# Patient Record
Sex: Male | Born: 1935 | Race: White | Hispanic: No | Smoking: Former smoker
Health system: Southern US, Community
[De-identification: ages and names within clinical notes are randomized; demographics above are authoritative.]

## PROBLEM LIST (undated history)

## (undated) DIAGNOSIS — J984 Other disorders of lung: Secondary | ICD-10-CM

## (undated) DIAGNOSIS — R6889 Other general symptoms and signs: Secondary | ICD-10-CM

## (undated) DIAGNOSIS — J1189 Influenza due to unidentified influenza virus with other manifestations: Secondary | ICD-10-CM

## (undated) DIAGNOSIS — C3490 Malignant neoplasm of unspecified part of unspecified bronchus or lung: Secondary | ICD-10-CM

## (undated) DIAGNOSIS — R0602 Shortness of breath: Secondary | ICD-10-CM

## (undated) DIAGNOSIS — R6883 Chills (without fever): Secondary | ICD-10-CM

## (undated) DIAGNOSIS — N390 Urinary tract infection, site not specified: Secondary | ICD-10-CM

## (undated) DIAGNOSIS — I10 Essential (primary) hypertension: Secondary | ICD-10-CM

## (undated) DIAGNOSIS — E7800 Pure hypercholesterolemia, unspecified: Secondary | ICD-10-CM

## (undated) HISTORY — DX: Pure hypercholesterolemia, unspecified: E78.00

## (undated) HISTORY — DX: Malignant neoplasm of unspecified part of unspecified bronchus or lung: C34.90

## (undated) HISTORY — DX: Other general symptoms and signs: R68.89

## (undated) HISTORY — DX: Other disorders of lung: J98.4

## (undated) HISTORY — DX: Essential (primary) hypertension: I10

## (undated) HISTORY — DX: Influenza due to unidentified influenza virus with other manifestations: J11.89

## (undated) HISTORY — DX: Urinary tract infection, site not specified: N39.0

## (undated) HISTORY — DX: Shortness of breath: R06.02

## (undated) HISTORY — DX: Chills (without fever): R68.83

---
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
## 1973-07-08 HISTORY — PX: HEMORRHOID SURGERY: SHX153

## 1973-07-08 HISTORY — PX: CHOLECYSTECTOMY: SHX55

## 2004-07-08 HISTORY — PX: THORACOTOMY: SUR1349

## 2005-07-08 HISTORY — PX: CORONARY ARTERY BYPASS GRAFT: SHX141

## 2006-08-01 ENCOUNTER — Ambulatory Visit: Payer: Self-pay | Admitting: Cardiology

## 2006-08-05 ENCOUNTER — Ambulatory Visit: Payer: Self-pay | Admitting: Cardiology

## 2006-08-05 LAB — CONVERTED CEMR LAB
Albumin: 3.2 g/dL — ABNORMAL LOW (ref 3.5–5.2)
Alkaline Phosphatase: 74 units/L (ref 39–117)
BUN: 21 mg/dL (ref 6–23)
Basophils Absolute: 0 10*3/uL (ref 0.0–0.1)
Calcium: 9.1 mg/dL (ref 8.4–10.5)
Cholesterol: 117 mg/dL (ref 0–200)
GFR calc Af Amer: 84 mL/min
HDL: 26.5 mg/dL — ABNORMAL LOW (ref >39.0)
Hemoglobin: 12 g/dL — ABNORMAL LOW (ref 13.0–17.0)
LDL Cholesterol: 72 mg/dL (ref 0–99)
Lymphocytes Relative: 31.9 % (ref 12.0–46.0)
MCHC: 35.4 g/dL (ref 30.0–36.0)
Monocytes Absolute: 0.4 10*3/uL (ref 0.2–0.7)
Monocytes Relative: 9.7 % (ref 3.0–11.0)
Neutro Abs: 2.3 10*3/uL (ref 1.4–7.7)
Platelets: 107 10*3/uL — ABNORMAL LOW (ref 150–400)
Potassium: 3.9 meq/L (ref 3.5–5.1)
RDW: 20.6 % — ABNORMAL HIGH (ref 11.5–14.6)
Triglycerides: 92 mg/dL (ref 0–149)

## 2006-08-07 ENCOUNTER — Encounter (HOSPITAL_COMMUNITY): Admission: RE | Admit: 2006-08-07 | Discharge: 2006-11-05 | Payer: Self-pay | Admitting: Cardiology

## 2006-09-02 ENCOUNTER — Encounter: Payer: Self-pay | Admitting: Physician Assistant

## 2006-09-02 ENCOUNTER — Ambulatory Visit: Payer: Self-pay | Admitting: Cardiology

## 2006-09-02 LAB — CONVERTED CEMR LAB
CO2: 24 meq/L (ref 19–32)
Calcium: 9.3 mg/dL (ref 8.4–10.5)
Chloride: 110 meq/L (ref 96–112)
Creatinine, Ser: 1 mg/dL (ref 0.4–1.5)
Glucose, Bld: 91 mg/dL (ref 70–99)

## 2006-09-15 ENCOUNTER — Ambulatory Visit: Payer: Self-pay

## 2006-09-15 ENCOUNTER — Encounter: Payer: Self-pay | Admitting: Internal Medicine

## 2006-09-22 ENCOUNTER — Ambulatory Visit: Payer: Self-pay | Admitting: Cardiology

## 2006-09-25 ENCOUNTER — Ambulatory Visit: Payer: Self-pay | Admitting: Cardiology

## 2006-09-29 ENCOUNTER — Ambulatory Visit: Payer: Self-pay | Admitting: Cardiology

## 2006-09-29 LAB — CONVERTED CEMR LAB
Basophils Relative: 1 % (ref 0.0–1.0)
CO2: 26 meq/L (ref 19–32)
Calcium: 9.1 mg/dL (ref 8.4–10.5)
Eosinophils Relative: 5.3 % — ABNORMAL HIGH (ref 0.0–5.0)
GFR calc Af Amer: 106 mL/min
Glucose, Bld: 93 mg/dL (ref 70–99)
HCT: 34.1 % — ABNORMAL LOW (ref 39.0–52.0)
Hemoglobin: 12.2 g/dL — ABNORMAL LOW (ref 13.0–17.0)
Lymphocytes Relative: 37 % (ref 12.0–46.0)
Monocytes Absolute: 0.4 10*3/uL (ref 0.2–0.7)
Neutro Abs: 1.9 10*3/uL (ref 1.4–7.7)
Platelets: 113 10*3/uL — ABNORMAL LOW (ref 150–400)
Prothrombin Time: 14.1 s — ABNORMAL HIGH (ref 10.0–14.0)
WBC: 3.9 10*3/uL — ABNORMAL LOW (ref 4.5–10.5)

## 2006-09-30 ENCOUNTER — Inpatient Hospital Stay (HOSPITAL_BASED_OUTPATIENT_CLINIC_OR_DEPARTMENT_OTHER): Admission: RE | Admit: 2006-09-30 | Discharge: 2006-10-01 | Payer: Self-pay | Admitting: Internal Medicine

## 2006-10-01 ENCOUNTER — Inpatient Hospital Stay (HOSPITAL_COMMUNITY): Admission: AD | Admit: 2006-10-01 | Discharge: 2006-10-02 | Payer: Self-pay | Admitting: Internal Medicine

## 2006-10-01 ENCOUNTER — Ambulatory Visit: Payer: Self-pay | Admitting: Internal Medicine

## 2006-10-01 ENCOUNTER — Ambulatory Visit: Payer: Self-pay | Admitting: Cardiothoracic Surgery

## 2006-10-06 ENCOUNTER — Ambulatory Visit (HOSPITAL_COMMUNITY): Admission: RE | Admit: 2006-10-06 | Discharge: 2006-10-06 | Payer: Self-pay | Admitting: Internal Medicine

## 2006-10-06 ENCOUNTER — Encounter (INDEPENDENT_AMBULATORY_CARE_PROVIDER_SITE_OTHER): Payer: Self-pay | Admitting: Specialist

## 2006-10-13 ENCOUNTER — Ambulatory Visit: Payer: Self-pay | Admitting: Internal Medicine

## 2006-10-15 ENCOUNTER — Encounter: Admission: RE | Admit: 2006-10-15 | Discharge: 2006-10-15 | Payer: Self-pay | Admitting: Internal Medicine

## 2006-10-16 ENCOUNTER — Ambulatory Visit: Payer: Self-pay | Admitting: Emergency Medicine

## 2006-10-24 ENCOUNTER — Ambulatory Visit: Payer: Self-pay | Admitting: Cardiothoracic Surgery

## 2006-11-03 ENCOUNTER — Ambulatory Visit: Payer: Self-pay | Admitting: Cardiology

## 2006-11-04 ENCOUNTER — Ambulatory Visit: Payer: Self-pay | Admitting: Internal Medicine

## 2006-11-06 ENCOUNTER — Encounter (HOSPITAL_COMMUNITY): Admission: RE | Admit: 2006-11-06 | Discharge: 2007-02-04 | Payer: Self-pay | Admitting: Cardiology

## 2006-11-10 ENCOUNTER — Encounter: Admission: RE | Admit: 2006-11-10 | Discharge: 2006-11-10 | Payer: Self-pay | Admitting: Cardiothoracic Surgery

## 2006-12-05 ENCOUNTER — Ambulatory Visit: Payer: Self-pay | Admitting: Cardiothoracic Surgery

## 2007-01-01 ENCOUNTER — Ambulatory Visit: Payer: Self-pay | Admitting: Internal Medicine

## 2007-01-29 ENCOUNTER — Ambulatory Visit: Payer: Self-pay | Admitting: Internal Medicine

## 2007-02-11 ENCOUNTER — Ambulatory Visit: Payer: Self-pay | Admitting: Emergency Medicine

## 2007-02-12 ENCOUNTER — Ambulatory Visit: Payer: Self-pay | Admitting: Emergency Medicine

## 2007-02-12 LAB — CONVERTED CEMR LAB
BUN: 19 mg/dL (ref 6–23)
CO2: 23 meq/L (ref 19–32)
Calcium: 9.1 mg/dL (ref 8.4–10.5)
Creatinine, Ser: 1.1 mg/dL (ref 0.4–1.5)
Potassium: 3.8 meq/L (ref 3.5–5.1)

## 2007-02-13 ENCOUNTER — Ambulatory Visit: Payer: Self-pay | Admitting: Cardiology

## 2007-02-25 ENCOUNTER — Ambulatory Visit: Payer: Self-pay | Admitting: Emergency Medicine

## 2007-02-27 ENCOUNTER — Ambulatory Visit (HOSPITAL_COMMUNITY): Admission: RE | Admit: 2007-02-27 | Discharge: 2007-02-27 | Payer: Self-pay | Admitting: Emergency Medicine

## 2007-03-13 DIAGNOSIS — I10 Essential (primary) hypertension: Secondary | ICD-10-CM | POA: Insufficient documentation

## 2007-03-13 DIAGNOSIS — E7800 Pure hypercholesterolemia, unspecified: Secondary | ICD-10-CM

## 2007-03-13 DIAGNOSIS — I2510 Atherosclerotic heart disease of native coronary artery without angina pectoris: Secondary | ICD-10-CM | POA: Insufficient documentation

## 2007-03-13 DIAGNOSIS — J984 Other disorders of lung: Secondary | ICD-10-CM | POA: Insufficient documentation

## 2007-03-16 ENCOUNTER — Ambulatory Visit: Payer: Self-pay | Admitting: Emergency Medicine

## 2007-04-15 ENCOUNTER — Ambulatory Visit: Payer: Self-pay | Admitting: Internal Medicine

## 2007-05-19 ENCOUNTER — Ambulatory Visit: Payer: Self-pay | Admitting: Internal Medicine

## 2007-05-20 ENCOUNTER — Ambulatory Visit: Payer: Self-pay | Admitting: Internal Medicine

## 2007-05-20 ENCOUNTER — Inpatient Hospital Stay (HOSPITAL_COMMUNITY): Admission: EM | Admit: 2007-05-20 | Discharge: 2007-05-20 | Payer: Self-pay | Admitting: Emergency Medicine

## 2007-05-22 ENCOUNTER — Telehealth: Payer: Self-pay | Admitting: Internal Medicine

## 2007-06-22 ENCOUNTER — Ambulatory Visit: Payer: Self-pay | Admitting: Internal Medicine

## 2007-06-22 ENCOUNTER — Telehealth: Payer: Self-pay | Admitting: Internal Medicine

## 2007-06-24 ENCOUNTER — Ambulatory Visit: Payer: Self-pay | Admitting: Internal Medicine

## 2007-06-24 ENCOUNTER — Telehealth: Payer: Self-pay | Admitting: Internal Medicine

## 2007-06-24 LAB — CONVERTED CEMR LAB
Basophils Absolute: 0 10*3/uL (ref 0.0–0.1)
Eosinophils Absolute: 0 10*3/uL (ref 0.0–0.6)
HCT: 32.5 % — ABNORMAL LOW (ref 39.0–52.0)
Hemoglobin: 11.5 g/dL — ABNORMAL LOW (ref 13.0–17.0)
Lymphocytes Relative: 5.2 % — ABNORMAL LOW (ref 12.0–46.0)
MCHC: 35.3 g/dL (ref 30.0–36.0)
MCV: 99.9 fL (ref 78.0–100.0)
Monocytes Absolute: 0.1 10*3/uL — ABNORMAL LOW (ref 0.2–0.7)
Neutro Abs: 4.2 10*3/uL (ref 1.4–7.7)
Neutrophils Relative %: 90.3 % — ABNORMAL HIGH (ref 43.0–77.0)

## 2007-08-11 ENCOUNTER — Ambulatory Visit: Payer: Self-pay | Admitting: Endocrinology

## 2007-08-14 ENCOUNTER — Ambulatory Visit: Payer: Self-pay | Admitting: Cardiovascular Disease

## 2007-08-14 ENCOUNTER — Inpatient Hospital Stay (HOSPITAL_COMMUNITY): Admission: AD | Admit: 2007-08-14 | Discharge: 2007-08-15 | Payer: Self-pay | Admitting: Internal Medicine

## 2007-08-14 ENCOUNTER — Encounter: Payer: Self-pay | Admitting: Internal Medicine

## 2007-08-14 ENCOUNTER — Ambulatory Visit: Payer: Self-pay | Admitting: Internal Medicine

## 2007-08-17 ENCOUNTER — Telehealth (INDEPENDENT_AMBULATORY_CARE_PROVIDER_SITE_OTHER): Payer: Self-pay | Admitting: *Deleted

## 2007-08-18 ENCOUNTER — Telehealth: Payer: Self-pay | Admitting: Internal Medicine

## 2007-08-19 ENCOUNTER — Encounter: Payer: Self-pay | Admitting: Internal Medicine

## 2007-08-25 ENCOUNTER — Telehealth: Payer: Self-pay | Admitting: Internal Medicine

## 2007-09-01 ENCOUNTER — Encounter: Payer: Self-pay | Admitting: Emergency Medicine

## 2007-09-04 ENCOUNTER — Ambulatory Visit: Payer: Self-pay | Admitting: Emergency Medicine

## 2007-09-04 LAB — CONVERTED CEMR LAB
GFR calc non Af Amer: 78 mL/min
Potassium: 4.1 meq/L (ref 3.5–5.1)
Sodium: 139 meq/L (ref 135–145)

## 2007-09-09 ENCOUNTER — Ambulatory Visit: Payer: Self-pay | Admitting: Cardiology

## 2007-12-05 ENCOUNTER — Encounter: Payer: Self-pay | Admitting: Internal Medicine

## 2007-12-05 ENCOUNTER — Ambulatory Visit: Payer: Self-pay | Admitting: Family Medicine

## 2007-12-05 LAB — CONVERTED CEMR LAB
Bilirubin Urine: NEGATIVE
Specific Gravity, Urine: 1.02
Urobilinogen, UA: 0.2

## 2007-12-08 ENCOUNTER — Encounter: Payer: Self-pay | Admitting: Internal Medicine

## 2008-01-02 ENCOUNTER — Encounter: Payer: Self-pay | Admitting: Internal Medicine

## 2008-01-02 ENCOUNTER — Ambulatory Visit: Payer: Self-pay | Admitting: Family Medicine

## 2008-01-02 LAB — CONVERTED CEMR LAB
Bilirubin Urine: NEGATIVE
Nitrite: NEGATIVE
Urobilinogen, UA: 0.2
pH: 8

## 2008-01-14 ENCOUNTER — Ambulatory Visit: Payer: Self-pay | Admitting: Cardiology

## 2008-01-14 LAB — CONVERTED CEMR LAB
ALT: 30 units/L (ref 0–53)
Bilirubin, Direct: 0.1 mg/dL (ref 0.0–0.3)
CO2: 25 meq/L (ref 19–32)
Calcium: 8.8 mg/dL (ref 8.4–10.5)
Creatinine, Ser: 1.3 mg/dL (ref 0.4–1.5)
GFR calc Af Amer: 69 mL/min
GFR calc non Af Amer: 57 mL/min
HDL: 36.1 mg/dL — ABNORMAL LOW (ref >39.0)
LDL Cholesterol: 56 mg/dL (ref 0–99)
Sodium: 135 meq/L (ref 135–145)
Total Bilirubin: 0.8 mg/dL (ref 0.3–1.2)
Total CHOL/HDL Ratio: 3.2

## 2008-04-29 ENCOUNTER — Telehealth: Payer: Self-pay | Admitting: Internal Medicine

## 2008-07-25 ENCOUNTER — Telehealth: Payer: Self-pay | Admitting: Internal Medicine

## 2008-07-25 ENCOUNTER — Observation Stay (HOSPITAL_COMMUNITY): Admission: AD | Admit: 2008-07-25 | Discharge: 2008-07-26 | Payer: Self-pay | Admitting: Internal Medicine

## 2008-07-25 ENCOUNTER — Ambulatory Visit: Payer: Self-pay | Admitting: Internal Medicine

## 2008-07-25 DIAGNOSIS — R0602 Shortness of breath: Secondary | ICD-10-CM | POA: Insufficient documentation

## 2008-07-26 ENCOUNTER — Encounter: Payer: Self-pay | Admitting: Internal Medicine

## 2008-08-01 ENCOUNTER — Ambulatory Visit: Payer: Self-pay

## 2008-08-01 ENCOUNTER — Encounter: Payer: Self-pay | Admitting: Internal Medicine

## 2008-11-01 ENCOUNTER — Telehealth: Payer: Self-pay | Admitting: Internal Medicine

## 2008-11-07 ENCOUNTER — Ambulatory Visit: Payer: Self-pay | Admitting: Cardiology

## 2008-11-08 ENCOUNTER — Ambulatory Visit: Payer: Self-pay | Admitting: Cardiology

## 2008-11-08 LAB — CONVERTED CEMR LAB
Albumin: 4 g/dL (ref 3.5–5.2)
CO2: 23 meq/L (ref 19–32)
Chloride: 107 meq/L (ref 96–112)
Cholesterol: 147 mg/dL (ref 0–200)
Glucose, Bld: 101 mg/dL — ABNORMAL HIGH (ref 70–99)
HDL: 29.9 mg/dL — ABNORMAL LOW (ref >39.00)
Potassium: 4.2 meq/L (ref 3.5–5.1)
Sodium: 139 meq/L (ref 135–145)
Total Bilirubin: 1 mg/dL (ref 0.3–1.2)
Total CHOL/HDL Ratio: 5
Triglycerides: 349 mg/dL — ABNORMAL HIGH (ref 0.0–149.0)
VLDL: 69.8 mg/dL — ABNORMAL HIGH (ref 0.0–40.0)

## 2008-12-09 IMAGING — CT CT BIOPSY
1 series · 15 of 33 positions shown, 19 images · non-contrast
Comparison: none

CLINICAL DATA: There is a pericardial loculated cystic lesion anterior to the right ventricle.
 CT GUIDED CORE BIOPSY AND ASPIRATION OF A PERICARDIAL CYSTIC LESION ? 10/06/06 AT 4044 HOURS:
 Procedure:  In the supine position, the subxiphoid area was prepped and draped in a sterile fashion.  Lidocaine was utilized for local anesthesia.  Under CT guidance, a 17 gauge guide-needle was inserted into the subcutaneous fat just to the left of the xiphoid process. The tip of the guide-needle was positioned just superficial to the wall of the cystic lesion.  A single 18 gauge core biopsy was obtained.
 The needle was then advanced into the cystic portion and 70 cc of clear yellow fluid was aspirated.  No complications.

[Series 2: routine chest · axial · 0.72mm/px · z∈[-240,-125]mm · 15 of 63 slices shown, 19 images]
[im 5/63  mediastinal]
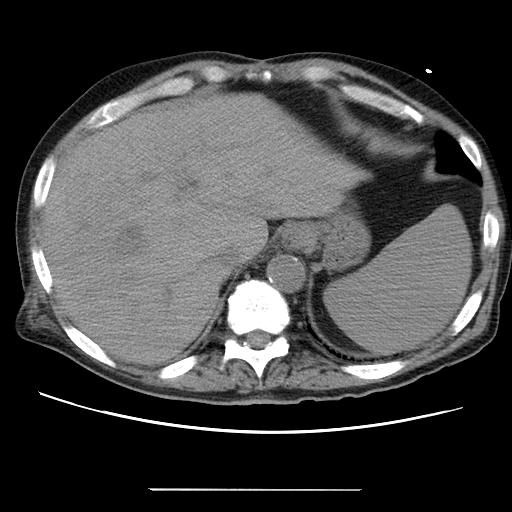
[im 5/63  lung]
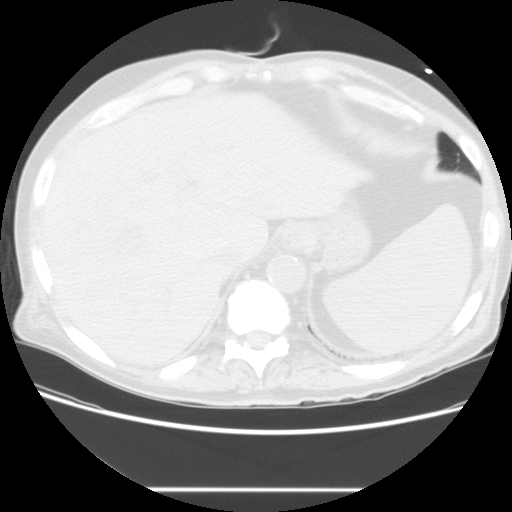
[im 10/63  lung]
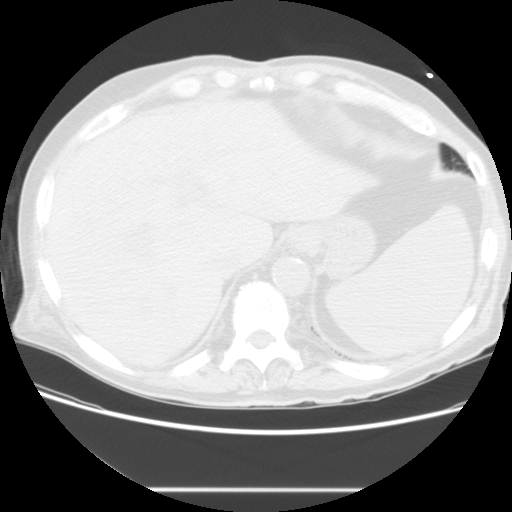
[im 13/63  lung]
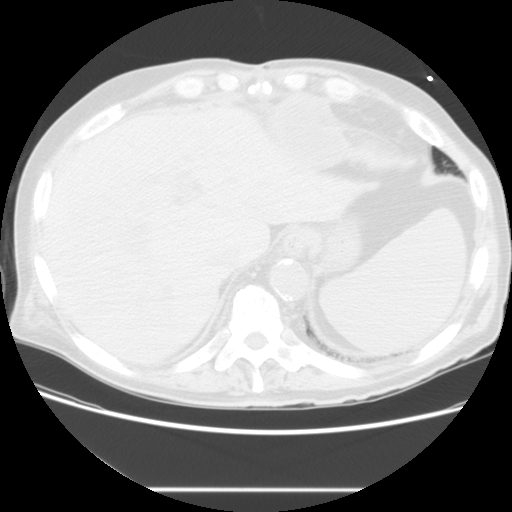
[im 17/63  lung]
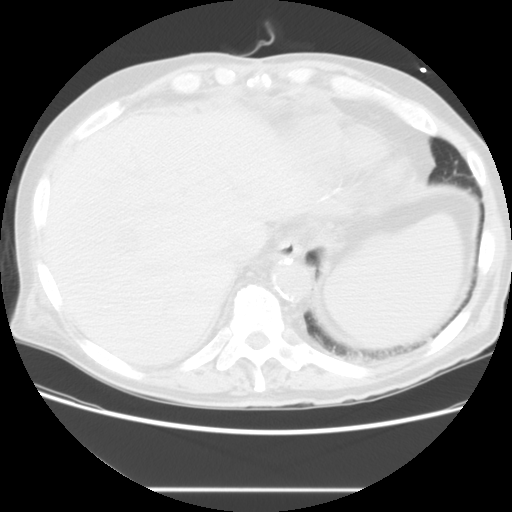
[im 21/63  mediastinal]
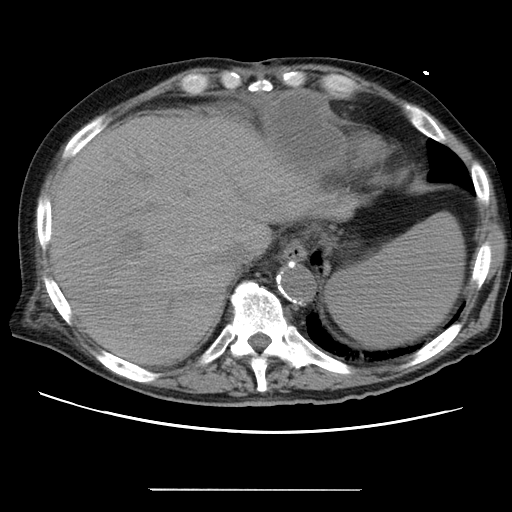
[im 21/63  lung]
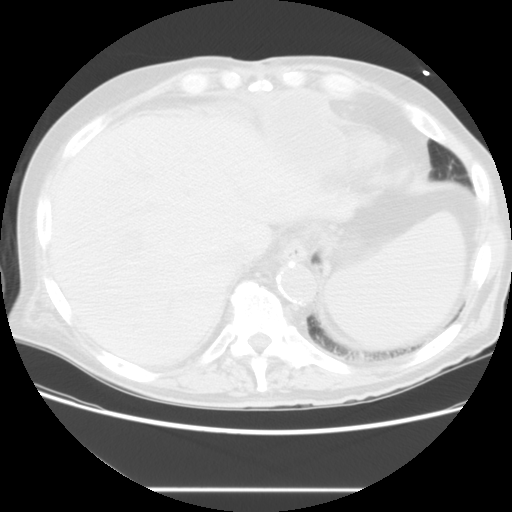
[im 25/63  lung]
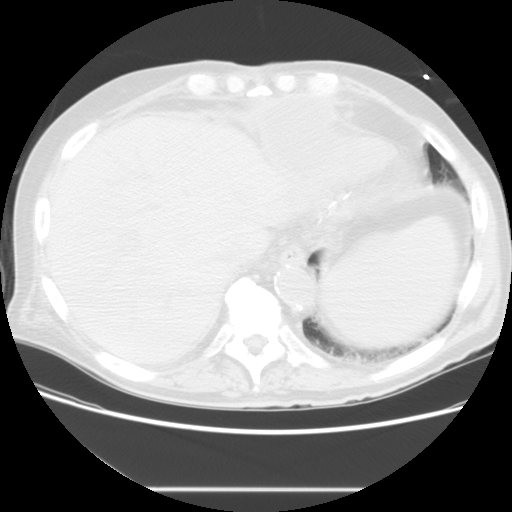
[im 28/63  lung]
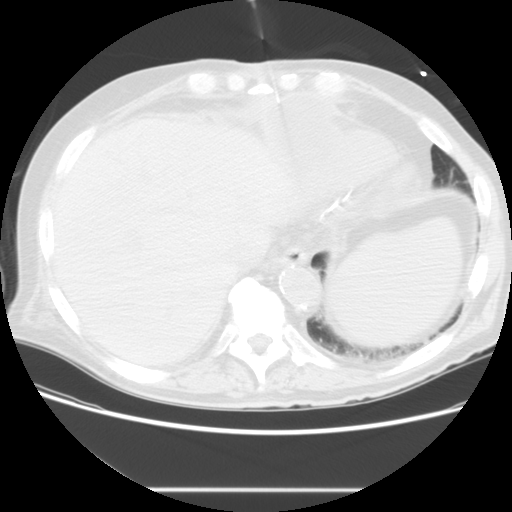
[im 33/63  lung]
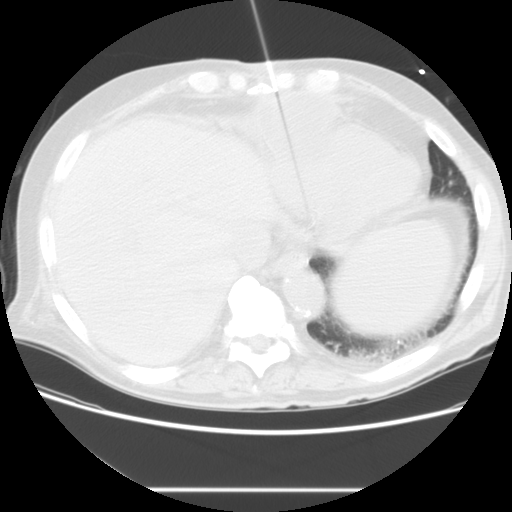
[im 37/63  mediastinal]
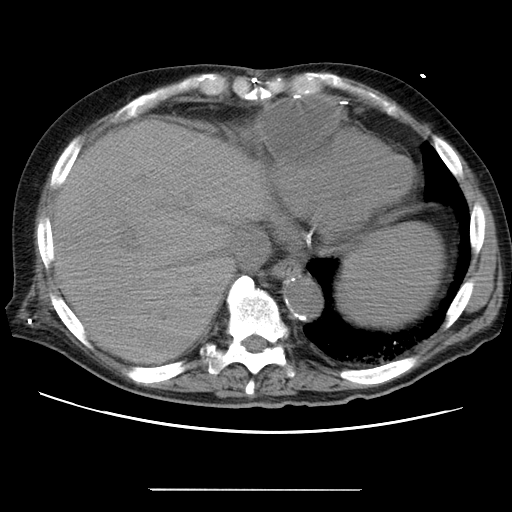
[im 37/63  lung]
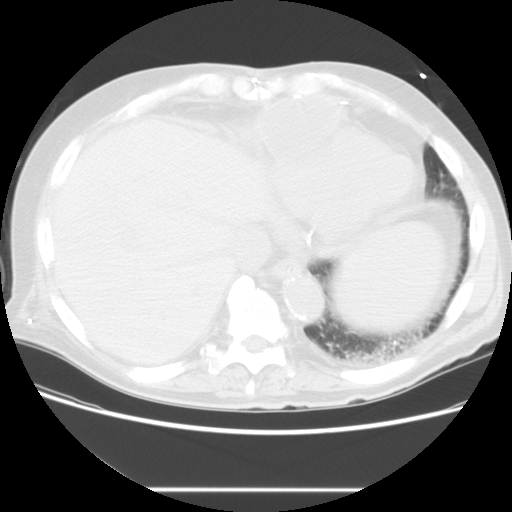
[im 40/63  lung]
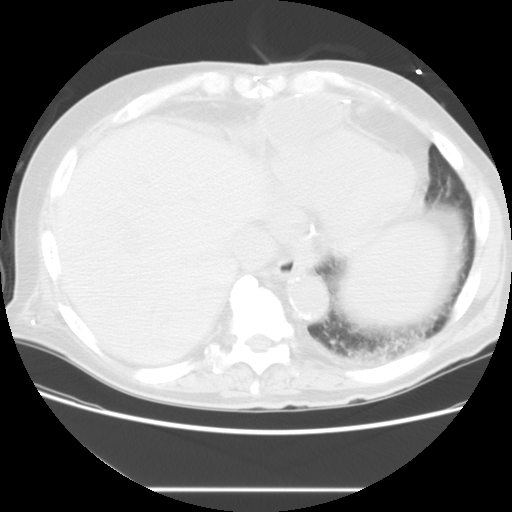
[im 44/63  lung]
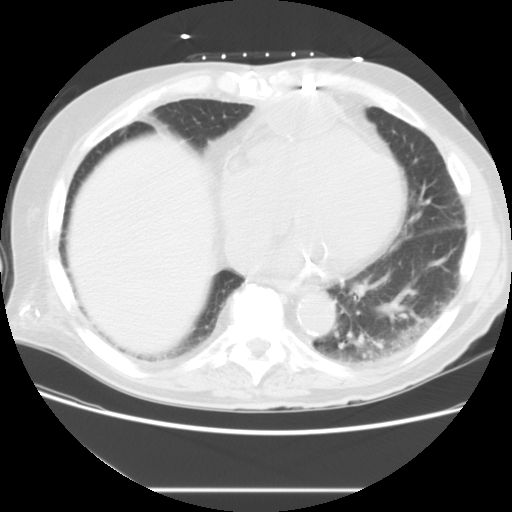
[im 47/63  lung]
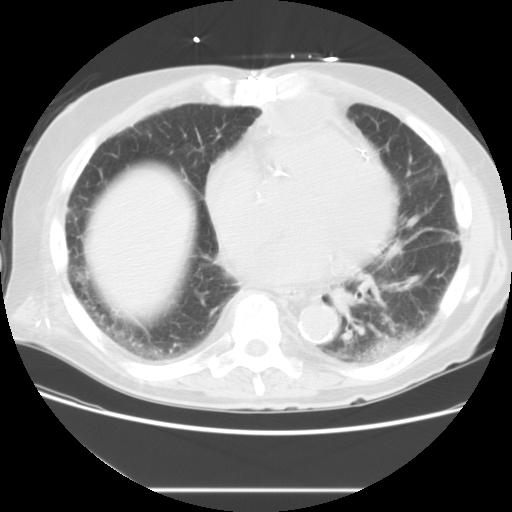
[im 50/63  mediastinal]
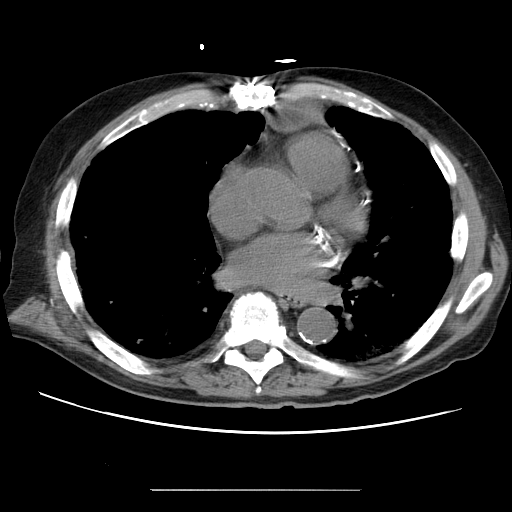
[im 50/63  lung]
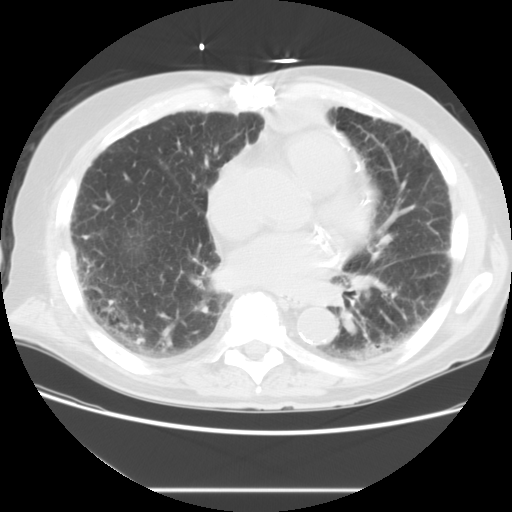
[im 53/63  lung]
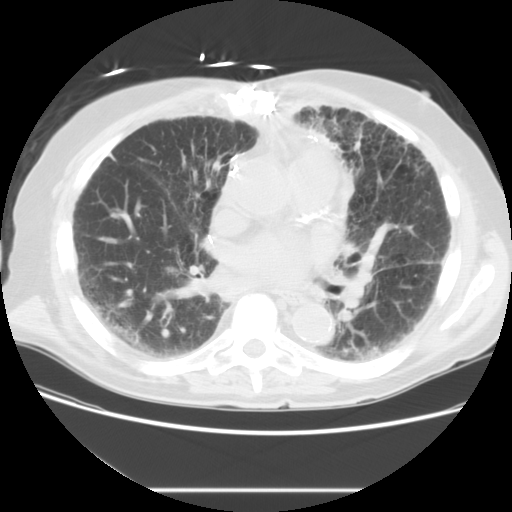
[im 58/63  lung]
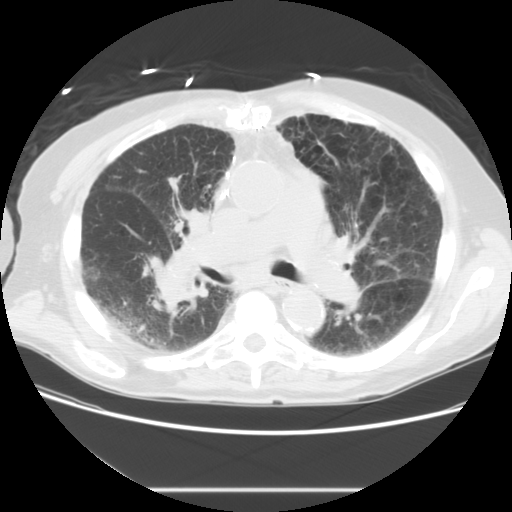

[15 of 33 positions shown; findings below may reference images not displayed]

FINDINGS: Images demonstrate needle placement at the edge of the cystic lesion in the pericardium.  Post-aspiration and post-biopsy images demonstrate near complete resolution of the fluid collection.  Some air has replaced the fluid within the cystic lesion. No pneumothorax.
IMPRESSION: Successful core biopsy of the wall of a cystic lesion in the pericardium as well as aspiration of the fluid.  The fluid was sent for cytology.

## 2008-12-09 IMAGING — CR DG CHEST 2V
2 series · 2 of 2 positions shown · non-contrast
Comparison: none

CLINICAL DATA: Chest mass status post biopsy

Chest 2 view:
Fluid level evident in the anterior pericardial mass post-biopsy. No
pneumothorax. Right hilar fullness with coarse bilateral perihilar and bibasilar
interstitial markings. Previous median sternotomy and CABG. No pleural effusion.
Visualized bones unremarkable.

[view not recorded (1 of 2)]
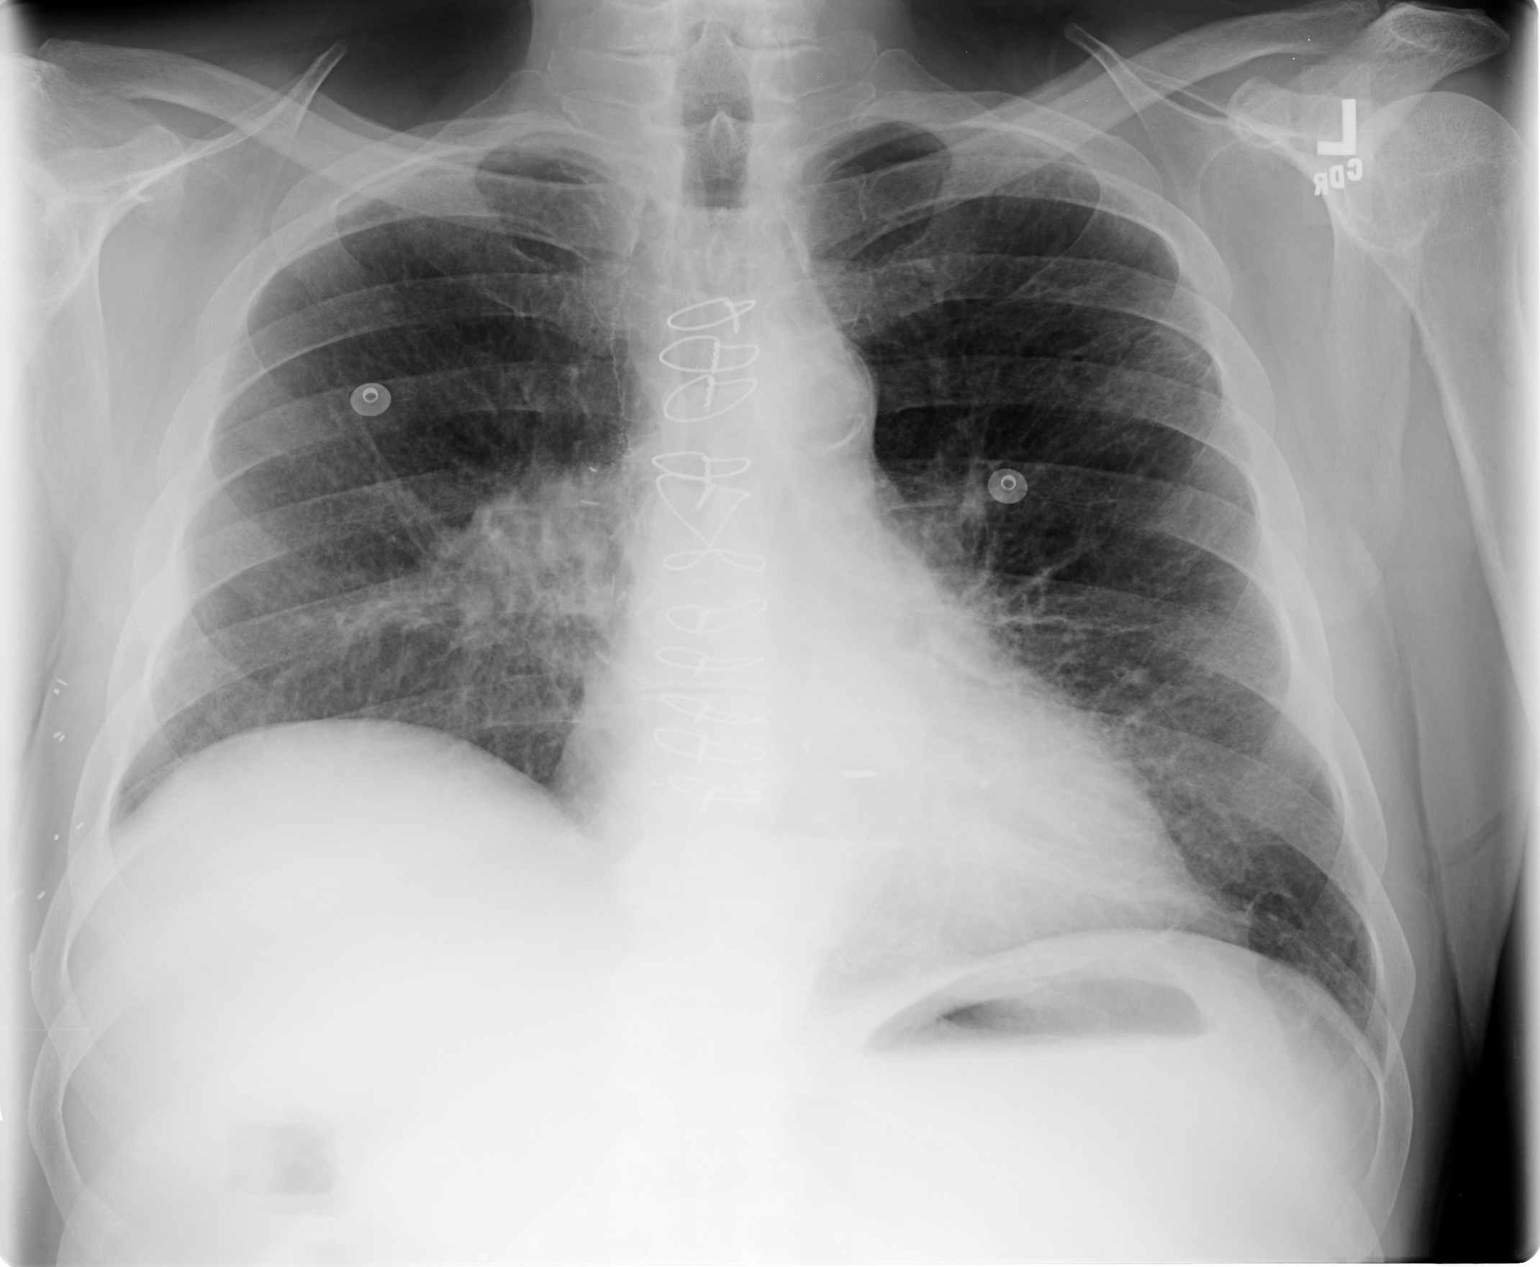

[view not recorded (2 of 2)]
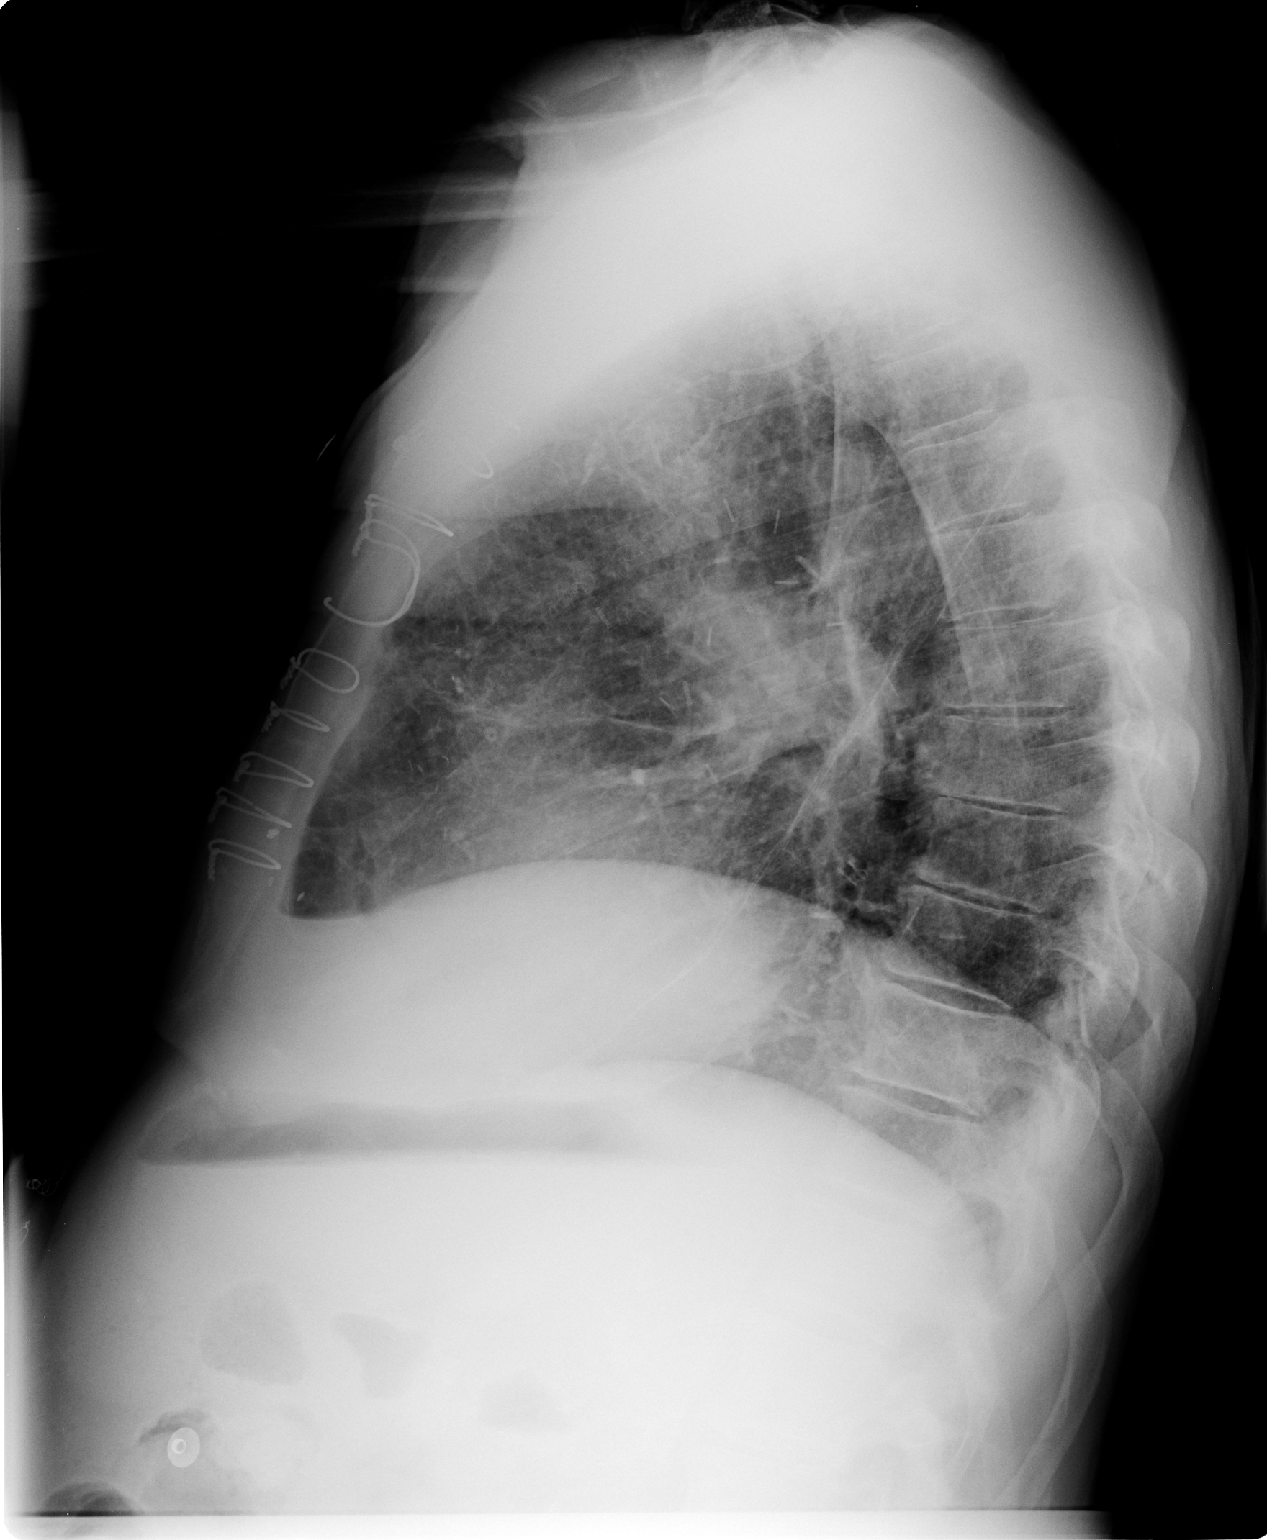

[2 of 2 positions shown; findings below may reference images not displayed]

IMPRESSION: 1. Air-fluid level in the cystic anterior pericardial mass post biopsy.

## 2009-02-27 ENCOUNTER — Ambulatory Visit: Payer: Self-pay | Admitting: Internal Medicine

## 2009-02-27 LAB — CONVERTED CEMR LAB
ALT: 28 units/L (ref 0–53)
AST: 33 units/L (ref 0–37)
Albumin: 4.2 g/dL (ref 3.5–5.2)
BUN: 16 mg/dL (ref 6–23)
Basophils Absolute: 0 10*3/uL (ref 0.0–0.1)
Bilirubin Urine: NEGATIVE
Chloride: 105 meq/L (ref 96–112)
Eosinophils Relative: 1.5 % (ref 0.0–5.0)
GFR calc non Af Amer: 62.52 mL/min (ref >60)
Glucose, Bld: 104 mg/dL — ABNORMAL HIGH (ref 70–99)
HCT: 38.9 % — ABNORMAL LOW (ref 39.0–52.0)
Hemoglobin: 13.5 g/dL (ref 13.0–17.0)
Ketones, ur: NEGATIVE mg/dL
Leukocytes, UA: NEGATIVE
Lymphs Abs: 0.4 10*3/uL — ABNORMAL LOW (ref 0.7–4.0)
MCV: 101.8 fL — ABNORMAL HIGH (ref 78.0–100.0)
Monocytes Absolute: 0.2 10*3/uL (ref 0.1–1.0)
Monocytes Relative: 4 % (ref 3.0–12.0)
Neutro Abs: 4.2 10*3/uL (ref 1.4–7.7)
Nitrite: NEGATIVE
Platelets: 90 10*3/uL — ABNORMAL LOW (ref 150.0–400.0)
Potassium: 4 meq/L (ref 3.5–5.1)
RDW: 12.1 % (ref 11.5–14.6)
Sed Rate: 71 mm/hr — ABNORMAL HIGH (ref 0–22)
Sodium: 138 meq/L (ref 135–145)
Total Bilirubin: 1.1 mg/dL (ref 0.3–1.2)
Urobilinogen, UA: 0.2 (ref 0.0–1.0)

## 2009-02-28 ENCOUNTER — Encounter: Payer: Self-pay | Admitting: Internal Medicine

## 2009-03-01 ENCOUNTER — Telehealth: Payer: Self-pay | Admitting: Internal Medicine

## 2009-03-02 ENCOUNTER — Ambulatory Visit: Payer: Self-pay | Admitting: Internal Medicine

## 2009-05-09 ENCOUNTER — Ambulatory Visit: Payer: Self-pay | Admitting: Internal Medicine

## 2009-07-22 IMAGING — CR DG CHEST 2V
2 series · 2 of 2 positions shown · non-contrast
Comparison: 10/06/06 and chest CT 02/13/07.

CLINICAL DATA: Fever.  Shakes.  Shortness of breath.
 [id number] VIEWS:

[w chest pa]
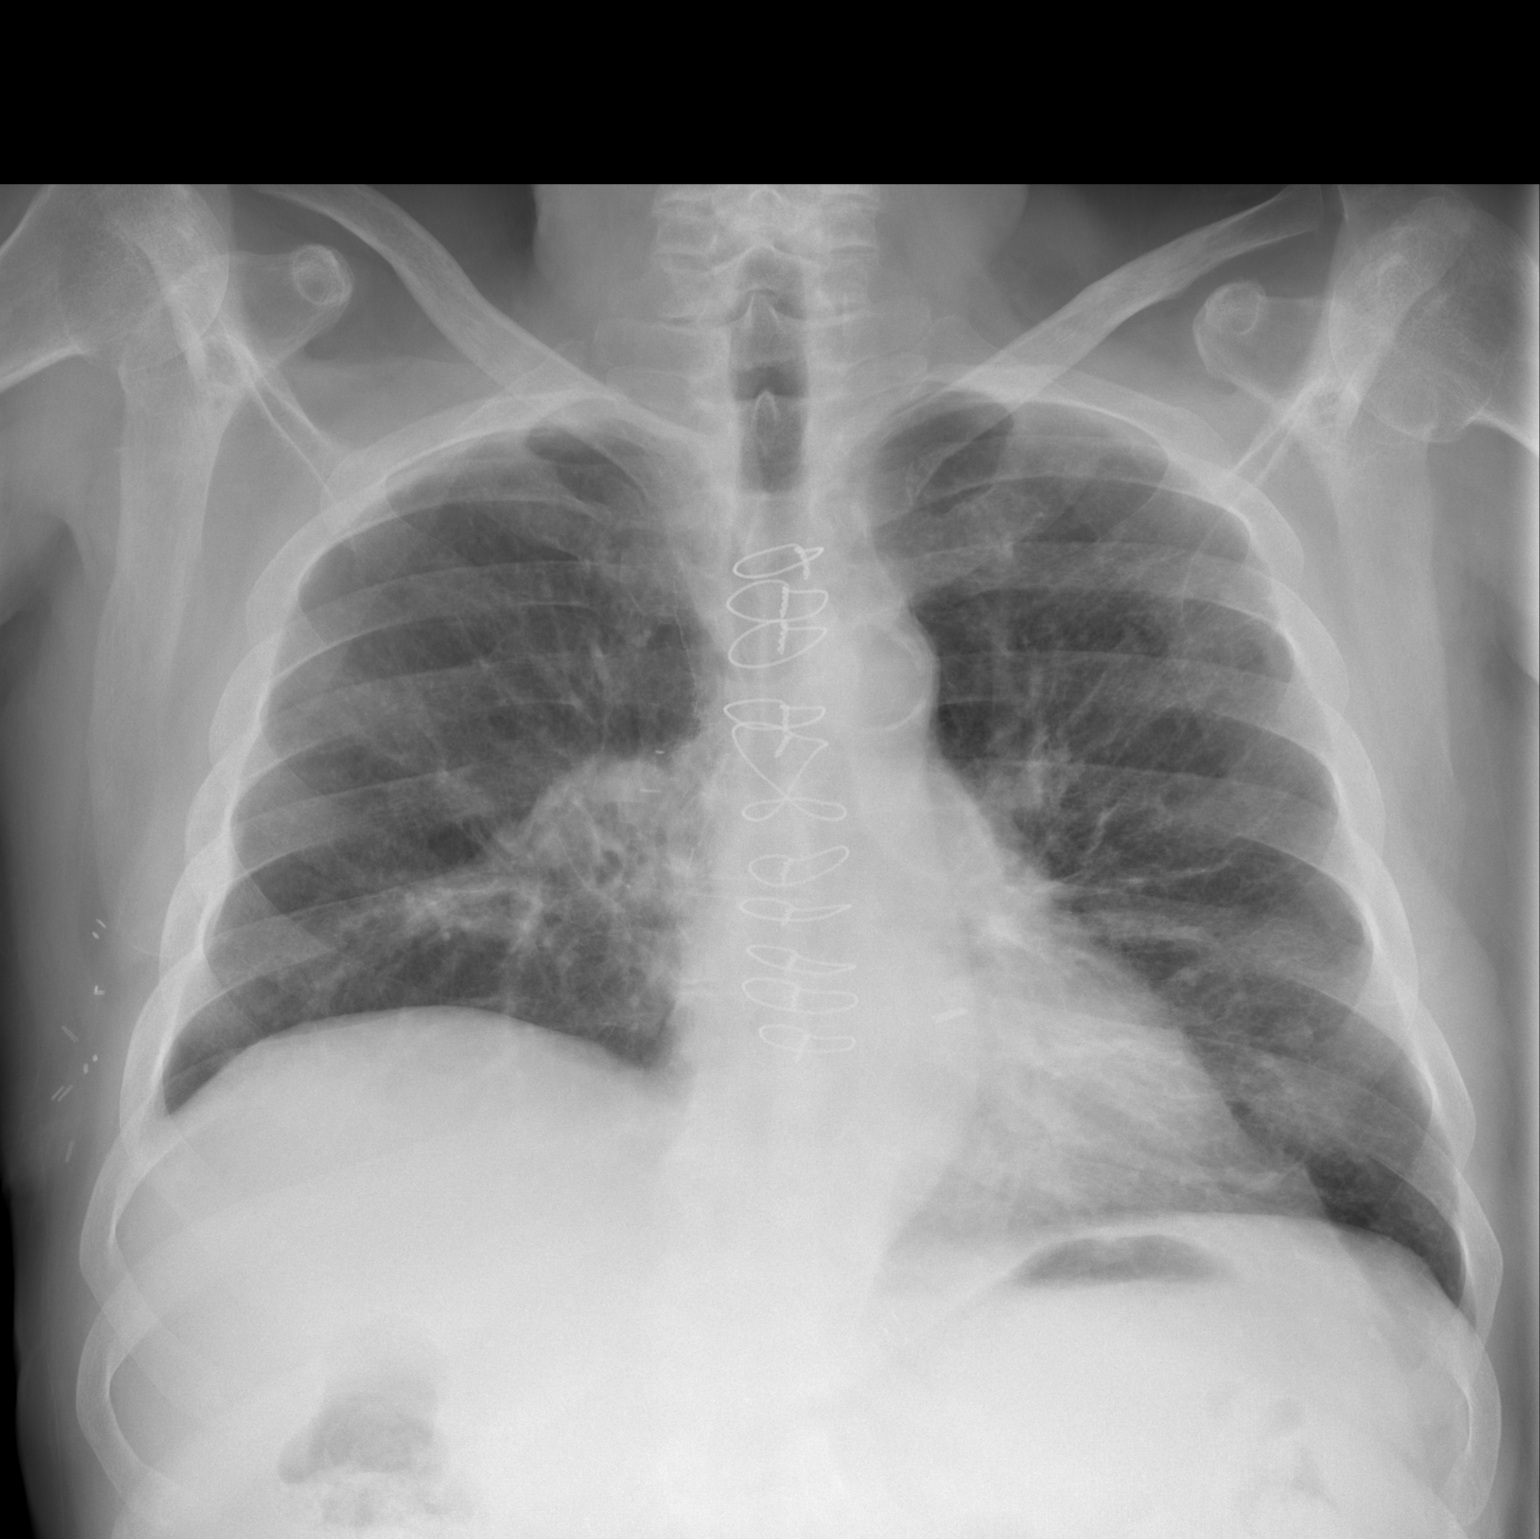

[w chest lat]
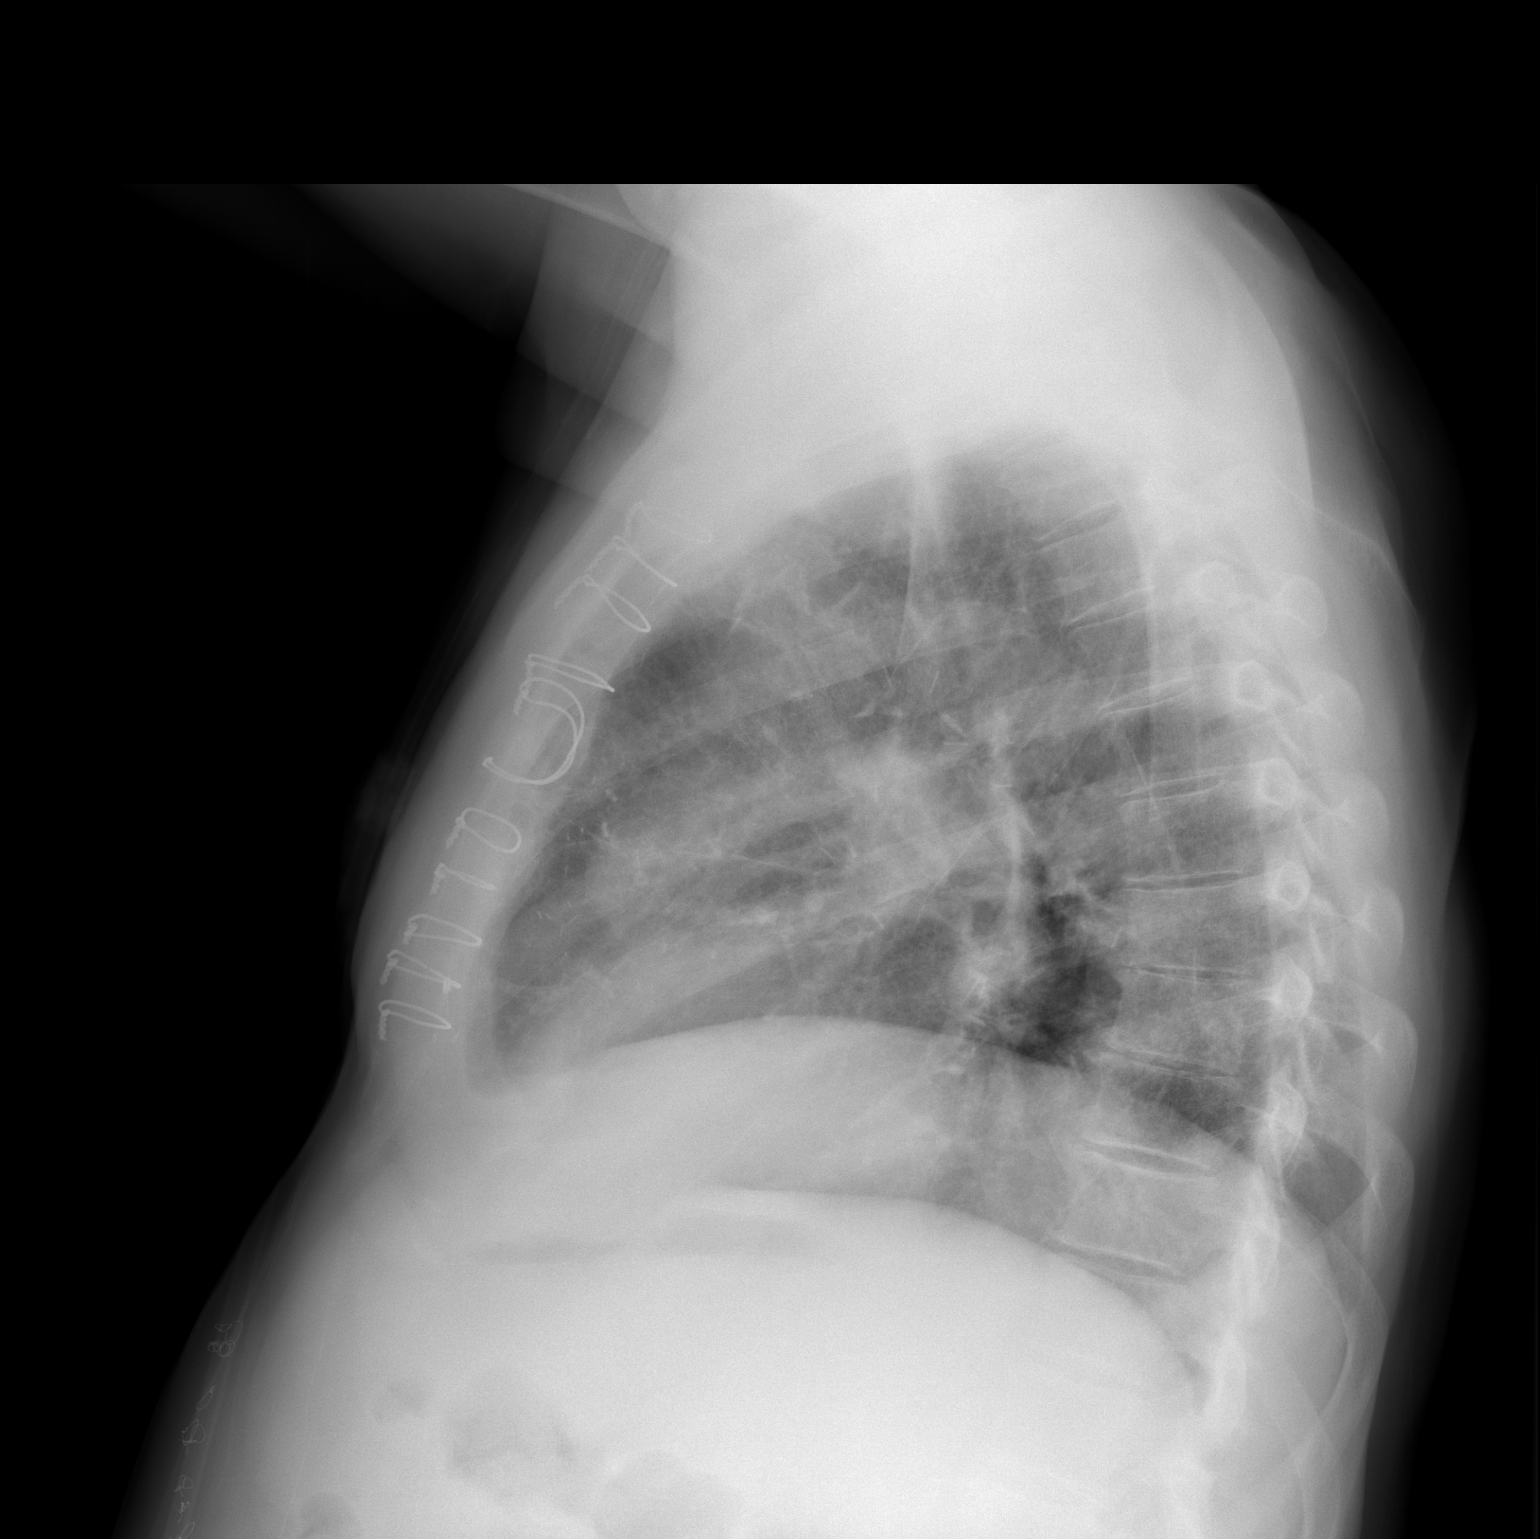

[2 of 2 positions shown; findings below may reference images not displayed]

FINDINGS: Patient is status post median sternotomy.  Fullness of the right hilum appears unchanged.  Surgical clips in the right hilum consistent with prior lobectomy again noted.  Previously seen fluid collection anterior to the heart is not as well visualized today.  Lungs are emphysematous.  Nodular opacity left lower lung zone may represent the patient?s nipple.
IMPRESSION: 1.  Emphysema without acute disease.
 2.  Postoperative change on the right.

## 2009-09-06 ENCOUNTER — Ambulatory Visit: Payer: Self-pay | Admitting: Internal Medicine

## 2009-09-06 DIAGNOSIS — J019 Acute sinusitis, unspecified: Secondary | ICD-10-CM

## 2009-11-06 ENCOUNTER — Ambulatory Visit: Payer: Self-pay | Admitting: Cardiology

## 2009-11-08 ENCOUNTER — Telehealth (INDEPENDENT_AMBULATORY_CARE_PROVIDER_SITE_OTHER): Payer: Self-pay | Admitting: Radiology

## 2010-04-23 ENCOUNTER — Ambulatory Visit: Payer: Self-pay | Admitting: Internal Medicine

## 2010-04-23 DIAGNOSIS — R413 Other amnesia: Secondary | ICD-10-CM | POA: Insufficient documentation

## 2010-04-23 LAB — CONVERTED CEMR LAB
ALT: 18 units/L (ref 0–53)
AST: 26 units/L (ref 0–37)
Alkaline Phosphatase: 62 units/L (ref 39–117)
Bilirubin, Direct: 0.2 mg/dL (ref 0.0–0.3)
Total Bilirubin: 0.9 mg/dL (ref 0.3–1.2)
Total CHOL/HDL Ratio: 3
Vitamin B-12: 884 pg/mL (ref 211–911)

## 2010-04-26 ENCOUNTER — Ambulatory Visit: Payer: Self-pay | Admitting: Cardiovascular Disease

## 2010-04-30 ENCOUNTER — Encounter: Payer: Self-pay | Admitting: Internal Medicine

## 2010-07-29 ENCOUNTER — Encounter: Payer: Self-pay | Admitting: Cardiothoracic Surgery

## 2010-08-07 NOTE — Progress Notes (Signed)
Summary: stress myoview cancelled  Phone Note Call from Patient   Caller: Patient Summary of Call: Pt. called to cancel his stress myoview scheduled for 11/09/09. Pt. stated he neither needs or wants the exam done in addition to the financial burden incurred by the exams. Initial call taken by: S.Byanka Landrus     Appended Document: stress myoview cancelled OK. i FIGURED HE WOULD DO THIS.

## 2010-08-07 NOTE — Assessment & Plan Note (Signed)
Summary: sore throat/men/cd   Vital Signs:  Patient profile:   77 year old male Height:      69.5 inches Weight:      181 pounds BMI:     26.44 O2 Sat:      97 % on Room air Temp:     96.7 degrees F oral Pulse rate:   70 / minute Pulse rhythm:   regular Resp:     16 per minute BP sitting:   138 / 76  (left arm) Cuff size:   large  Vitals Entered By: Rock Nephew CMA (September 06, 2009 1:12 PM)  Nutrition Counseling: Patient's BMI is greater than 25 and therefore counseled on weight management options.  O2 Flow:  Room air CC: Cough, congestion, headache, sore throat x 4days, URI symptoms Is Patient Diabetic? No   Primary Care Provider:  [facility]  CC:  Cough, congestion, headache, sore throat x 4days, and URI symptoms.  History of Present Illness:  URI Symptoms      This is a 77 year old man who presents with URI symptoms.  The symptoms began 4 days ago.  The severity is described as moderate.  The patient reports nasal congestion, purulent nasal discharge, sore throat, and productive cough, but denies earache and sick contacts.  The patient denies low-grade fever (<100.5 degrees), stiff neck, dyspnea, wheezing, rash, vomiting, diarrhea, use of an antipyretic, and response to antipyretic.  The patient denies headache, muscle aches, and severe fatigue.  Risk factors for Strep sinusitis include absence of cough.  The patient denies the following risk factors for Strep sinusitis: unilateral facial pain, unilateral nasal discharge, and tender adenopathy.    Preventive Screening-Counseling & Management  Alcohol-Tobacco     Alcohol drinks/day: <1     Smoking Status: quit > 6 months  Hep-HIV-STD-Contraception     Hepatitis Risk: no risk noted     HIV Risk: no risk noted     STD Risk: no risk noted      Drug Use:  never.    Medications Prior to Update: 1)  Pravachol 40 Mg  Tabs (Pravastatin Sodium) .... Take 1 Tablet By Mouth Once A Day 2)  Ra Iron 325 (65 Fe) Mg  Tabs (Ferrous  Sulfate) .... Take 1 Tablet By Mouth Once A Day 3)  Meloxicam 15 Mg  Tabs (Meloxicam) .... Take 1 Tablet By Mouth Once A Day 4)  Mirtazapine 15 Mg  Tabs (Mirtazapine) .... Take 1 Tablet By Mouth Once A Day 5)  Amitriptyline Hcl 25 Mg Tabs (Amitriptyline Hcl) .... Take 1 Tab By Mouth At Bedtime 6)  Fluconazole 100 Mg Tabs (Fluconazole) .Marland Kitchen.. 1 By Mouth Once Daily  X 14 7)  Lamisil At 1 % Crea (Terbinafine Hcl) .... Apply Twice A Day To Affected Area  Current Medications (verified): 1)  Pravachol 40 Mg  Tabs (Pravastatin Sodium) .... Take 1 Tablet By Mouth Once A Day 2)  Meloxicam 15 Mg  Tabs (Meloxicam) .... Take 1 Tablet By Mouth Once A Day 3)  Mirtazapine 15 Mg  Tabs (Mirtazapine) .... Take 1 Tablet By Mouth Once A Day 4)  Amitriptyline Hcl 25 Mg Tabs (Amitriptyline Hcl) .... Take 1 Tab By Mouth At Bedtime 5)  Fluconazole 100 Mg Tabs (Fluconazole) .Marland Kitchen.. 1 By Mouth Once Daily  X 14 6)  Zithromax Tri-Pak 500 Mg Tab (Azithromycin) .... Take As Directed One By Mouth Once Daily For 3 Days 7)  Guiatuss Ac 100-10 Mg/17ml Syrp (Guaifenesin-Codeine) .... 5-10 Ml  By Mouth Qid As Needed For Cough  Allergies (verified): 1)  ! Keflex  Past History:  Past Medical History: Reviewed history from 10/27/2008 and no changes required. 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 SP CABG 2007 Thoracotomy R upper lobecotomy in 2006 Appendectomy...1945 Cholecystectomy....1975 Tonsillectomy..1942 Hemorrhoidectomy..1975  Family History: Reviewed history from 10/27/2008 and no changes required. Denies family hx of CAD Father: died at 24  .Marland KitchenAlzheimers Mother: died at 69 Family History of Hyperlipidemia: Sister  Social History: Reviewed history from 10/27/2008 and no changes required. Single Divorced Alcohol use-yes Former Smoker Retired   Review of Systems  The patient denies anorexia, fever, weight loss, decreased hearing, hoarseness, chest pain, headaches, hemoptysis, abdominal pain, hematuria, suspicious skin lesions, and enlarged lymph nodes.    Physical Exam  General:  alert, well-developed, well-nourished, well-hydrated, appropriate dress, normal appearance, healthy-appearing, cooperative to examination, and good hygiene.   Head:  normocephalic, atraumatic, no abnormalities observed, and no abnormalities palpated.   Ears:  R ear normal and L ear normal.   Nose:  no external deformity, nasal dischargemucosal pallor, mucosal edema, L maxillary sinus tenderness, and R maxillary sinus tenderness.   Mouth:  no exudates, no posterior lymphoid hypertrophy, no postnasal drip, no pharyngeal crowing, no lesions, no aphthous ulcers, no erosions, no tongue abnormalities, no leukoplakia, no petechiae, and pharyngeal erythema.   Neck:  supple, full ROM, no masses, no thyromegaly, no JVD, no cervical lymphadenopathy, and no neck tenderness.   Lungs:  normal respiratory effort, no intercostal retractions, no accessory muscle use, normal breath sounds, no dullness, and no fremitus.   Heart:  normal rate, regular rhythm, no murmur, no gallop, no rub, and no JVD.   Abdomen:  soft, non-tender, normal bowel sounds, no distention, no masses, no guarding, no rigidity, no hepatomegaly, and no splenomegaly.   Msk:  No deformity or scoliosis noted of thoracic or lumbar spine.   Pulses:  R and L carotid,radial,femoral,dorsalis pedis and posterior tibial pulses are full and equal bilaterally Extremities:  No clubbing, cyanosis, edema, or deformity noted with normal full range of motion of all joints.   Neurologic:  No cranial nerve deficits  noted. Station and gait are normal. Plantar reflexes are down-going bilaterally. DTRs are symmetrical throughout. Sensory, motor and coordinative functions appear intact. Skin:  Intact without suspicious lesions or rashes Cervical Nodes:  no anterior cervical adenopathy and no posterior cervical adenopathy.   Axillary Nodes:  no R axillary adenopathy and no L axillary adenopathy.   Psych:  Cognition and judgment appear intact. Alert and cooperative with normal attention span and concentration. No apparent delusions, illusions, hallucinations   Impression & Recommendations:  Problem # 1:  SINUSITIS- ACUTE-NOS (ICD-461.9) Assessment New  His updated medication list for this problem includes:    Zithromax Tri-pak 500 Mg Tab (Azithromycin) .Marland Kitchen... Take as directed one by mouth once daily for 3 days    Guiatuss Ac 100-10 Mg/43ml Syrp (Guaifenesin-codeine) .Marland Kitchen... 5-10 ml by mouth qid as needed for cough  Orders: Prescription Created Electronically [phone])  Instructed on treatment. Call if symptoms persist or worsen.   Problem # 2:  COUGH (ICD-786.2) Assessment: New  Orders: T-2 View CXR (71020TC)  Problem # 3:  HYPERTENSION (ICD-401.9) Assessment: Improved  BP today: 138/76  Prior BP: 138/72 (05/09/2009)  Prior 10 Yr Risk Heart Disease: N/A (02/27/2009)  Labs Reviewed: K+: 4.0 (02/27/2009) Creat: : 1.2 (02/27/2009)   Chol: 147 (11/08/2008)   HDL: 29.90 (11/08/2008)   LDL: 56 (01/14/2008)   TG: 349.0 (11/08/2008)  Problem # 4:  PULMONARY NODULE, LEFT UPPER LOBE (ICD-518.89) Assessment: Unchanged  Orders: T-2 View CXR (71020TC)  Complete Medication List: 1)  Pravachol 40 Mg Tabs (Pravastatin sodium) .... Take 1 tablet by mouth once a day 2)  Meloxicam 15 Mg Tabs (Meloxicam) .... Take 1 tablet by mouth once a day 3)  Mirtazapine 15 Mg Tabs (Mirtazapine) .... Take 1 tablet by mouth once a day 4)  Amitriptyline Hcl 25 Mg Tabs (Amitriptyline hcl) .... Take 1 tab by mouth at  bedtime 5)  Fluconazole 100 Mg Tabs (Fluconazole) .Marland Kitchen.. 1 by mouth once daily  x 14 6)  Zithromax Tri-pak 500 Mg Tab (Azithromycin) .... Take as directed one by mouth once daily for 3 days 7)  Guiatuss Ac 100-10 Mg/97ml Syrp (Guaifenesin-codeine) .... 5-10 ml by mouth qid as needed for cough  Patient Instructions: 1)  Please schedule a follow-up appointment in 2 weeks. 2)  Take your antibiotic as prescribed until ALL of it is gone, but stop if you develop a rash or swelling and contact our office as soon as possible. 3)  Acute sinusitis symptoms for less than 10 days are not helped by antibiotics.Use warm moist compresses, and over the counter decongestants ( only as directed). Call if no improvement in 5-7 days, sooner if increasing pain, fever, or new symptoms. Prescriptions: GUIATUSS AC 100-10 MG/5ML SYRP (GUAIFENESIN-CODEINE) 5-10 ml by mouth QID as needed for cough  #6 ounces x 1   Entered and Authorized by:   Etta Grandchild MD   Signed by:   Etta Grandchild MD on 09/06/2009   Method used:   Print then Give to Patient   RxID:   [id number] ZITHROMAX TRI-PAK 500 MG TAB (AZITHROMYCIN) Take as directed one by mouth once daily for 3 days  #3 x 0   Entered and Authorized by:   Etta Grandchild MD   Signed by:   Etta Grandchild MD on 09/06/2009   Method used:   Electronically to        [facility]. [phone]* (retail)       [address]       [place], [place]  [address]       Ph: [phone]       Fax: [phone]   RxID:   [id number]

## 2010-08-07 NOTE — Letter (Signed)
Summary: [facility]  [facility]   Imported By: Sherian Rein 04/27/2010 11:14:09  _____________________________________________________________________  External Attachment:    Type:   Image     Comment:   External Document

## 2010-08-07 NOTE — Assessment & Plan Note (Signed)
Summary: memory loss,runny nose/cd   Vital Signs:  Patient profile:   77 year old male Height:      69.5 inches Weight:      176 pounds BMI:     25.71 O2 Sat:      98 % on Room air Temp:     97.5 degrees F oral Pulse rate:   69 / minute BP sitting:   150 / 60  (left arm) Cuff size:   regular  Vitals Entered By: Alysia Penna (April 23, 2010 3:17 PM)  O2 Flow:  Room air CC: pt c/o of memory loss. also having problem with runny nose for mths. /cp sma. Comments pt no longer taking Meloxicam, amitriptyline, fluconzole, guituss, aspirin.   Primary Care Provider:  [facility]  CC:  pt c/o of memory loss. also having problem with runny nose for mths. /cp sma..  History of Present Illness: [name] presents with daughter with him. Patient is having memory problems. He is particularly having trouble with  names of people, generalized naming and difficulty with word finding. He also feels that he is missing several other items, places and things. Has not missed paying bills, he manages his checking account. This is all coroborated by his daughter.  He complains of rhinorrhea - no purulence.  Current Medications (verified): 1)  Pravachol 40 Mg  Tabs (Pravastatin Sodium) .... Take 1 Tablet By Mouth Once A Day 2)  Meloxicam 15 Mg  Tabs (Meloxicam) .... Take 1 Tablet By Mouth Once A Day 3)  Mirtazapine 15 Mg  Tabs (Mirtazapine) .... Take 1 Tablet By Mouth Once A Day 4)  Amitriptyline Hcl 25 Mg Tabs (Amitriptyline Hcl) .... Take 1 Tab By Mouth At Bedtime 5)  Fluconazole 100 Mg Tabs (Fluconazole) .Marland Kitchen.. 1 By Mouth Once Daily  X 14 6)  Guiatuss Ac 100-10 Mg/49ml Syrp (Guaifenesin-Codeine) .... 5-10 Ml By Mouth Qid As Needed For Cough 7)  Aspirin 81 Mg Tbec (Aspirin) .... Take One Tablet By Mouth Daily  Allergies (verified): 1)  ! 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  Past Surgical History: Last updated: 10/27/2008 SP CABG 2007 Thoracotomy R upper lobecotomy in 2006 Appendectomy...1945 Cholecystectomy....1975 Tonsillectomy..1942 Hemorrhoidectomy..1975  Family History: Last updated: 10/27/2008 Denies family hx of CAD Father: died at 29 .Marland KitchenAlzheimers Mother: died at 24 Family History of Hyperlipidemia: Sister  Social History: Last updated: 10/27/2008 Single Divorced Alcohol use-yes Former Smoker Retired   Review of Systems  The patient denies anorexia, fever, weight loss, weight gain, decreased hearing, chest pain, syncope, dyspnea on exertion, prolonged cough, abdominal pain, severe indigestion/heartburn, muscle weakness, transient blindness, difficulty walking, unusual weight change, abnormal bleeding, and enlarged lymph nodes.    Physical Exam  General:  alert, well-developed, and well-nourished.   Head:  normocephalic and no abnormalities observed.   Eyes:  vision grossly intact, pupils equal, pupils round, and corneas and lenses clear.   Neck:  supple and full ROM.   Lungs:  normal respiratory effort and normal breath sounds.   Heart:  normal rate, regular rhythm, and no JVD.   Neurologic:  MMSE 1) day- ok, date-no, year ok 2) Content - pres -ok, sen/gov - no, news - no 3) 5 fwd - ok, 5 rev - 2 errors,  world -ok 4) serial 7's -ok, nickles - ok, change - ok 5) 3 words: 1/3  6) naming, 4-legged creatures 7) parables - glass house - weak; rolling stone - weak 8) clockface- fluid and correct 9) judgement - OK   alert & oriented X3, cranial nerves II-XII intact, and gait normal.   Psych:  normally interactive, good eye contact, and not anxious appearing.     Impression &  Recommendations:  Problem # 1:  MEMORY LOSS (ICD-780.93) Patient with c/o memory loss. MMSE reveals deficits in word recall, abstract thinking. Findings alone are mild but with the patients subjective complaint and his former very high level of executive function he has symptoms that require evaluation and treatment.  Plan - lab to r/o metabolic cause of memory loss           start treatment with aricept 5mg  once daily x 30days and then advance to 10mg  once daily.   Plan -  Orders: TLB-B12 + Folate Pnl (82746_82607-B12/FOL) TLB-TSH (Thyroid Stimulating Hormone) (84443-TSH) T-RPR (Syphilis) (32440-10272) Radiology Referral (Radiology)  Addendum - labs are normal.  Problem # 2:  HYPERCHOLESTEROLEMIA (ICD-272.0)  His updated medication list for this problem includes:    Pravachol 40 Mg Tabs (Pravastatin sodium) .Marland Kitchen... Take 1 tablet by mouth once a day  Orders: TLB-Lipid Panel (80061-LIPID) TLB-Hepatic/Liver Function Pnl (80076-HEPATIC)  Excellent control on present medication.  Problem # 3:  HYPERTENSION (ICD-401.9)  BP today: 150/60 Prior BP: 130/70 (11/06/2009)  Prior 10 Yr Risk Heart Disease: N/A (02/27/2009)  Labs Reviewed: K+: 4.0 (02/27/2009) Creat: : 1.2 (02/27/2009)     Suboptimal control today with previous readings that were good.  Plan - further monitoring - if remains elevated will adjust medications.  Complete Medication List: 1)  Pravachol 40 Mg Tabs (Pravastatin sodium) .... Take 1 tablet by mouth once a day 2)  Mirtazapine 15 Mg Tabs (Mirtazapine) .... Take 1 tablet by mouth once a day 3)  Aspirin 81 Mg Tbec (Aspirin) .... Take one tablet by mouth daily 4)  Aricept 5 Mg Tabs (Donepezil hcl) .Marland Kitchen.. 1 by mouth once daily for memory loss 5)  Loratadine 10 Mg Tabs (Loratadine) .Marland Kitchen.. 1 by mouth once daily as needed runny nose.  Patient: [name] Note: All result statuses are Final unless otherwise noted.  Tests: (1) Lipid Panel (LIPID)   Cholesterol                132 mg/dL                   5-366     ATP III Classification            Desirable:  < 200 mg/dL                    Borderline High:  200 - 239 mg/dL               High:  > = 240 mg/dL   Triglycerides             91.0 mg/dL                  4.4-034.7     Normal:  <150 mg/dL     Borderline High:  425 - 199 mg/dL   HDL                       95.63 mg/dL                 >  39.00   VLDL Cholesterol          18.2 mg/dL                  0.4-54.0   LDL Cholesterol           74 mg/dL                    9-81  CHO/HDL Ratio:  CHD Risk                             3                    Men          Women     1/2 Average Risk     3.4          3.3     Average Risk          5.0          4.4     2X Average Risk          9.6          7.1     3X Average Risk          15.0          11.0                           Tests: (2) Hepatic/Liver Function Panel (HEPATIC)   Total Bilirubin           0.9 mg/dL                   1.9-1.4   Direct Bilirubin          0.2 mg/dL                   7.8-2.9   Alkaline Phosphatase      62 U/L                      39-117   AST                       26 U/L                      0-37   ALT                       18 U/L                      0-53   Total Protein             7.7 g/dL                    5.6-2.1   Albumin                   4.0 g/dL                    3.0-8.6  Tests: (3) B12 + Folate Panel (B12/FOL)   Vitamin B12               884 pg/mL                   211-911   Folate  3.7 ng/mL     Deficient  0.4 - 3.4 ng/mL     Indeterminate  3.4 - 5.4 ng/mL     Normal  >5.4 ng/mL  Tests: (4) TSH (TSH)   FastTSH                   3.29 uIU/mL                 0.35-5.50 Tests: (1) RPR Reflex to T.pallidum Ab, Total (82956)   RPR                       NON REAC                    NON REAC  Patient Instructions: 1)  For Runny nose - over the coutner loratadine 10mg  once a day.  2)  Memory loss - mild issues on mental status exam. Plan - labs to rule  out organic causes of memory loss including a CT of the brain. Start aricept 5mg  once a day for 30 days. Call me back and let me know if tolerated and we will advance to stnadard dose of 10mg  once a day. 3)  Sleep- sounds like you are doing all the right things except DO NOT lay in bed awake for any period of time.  Prescriptions: ARICEPT 5 MG TABS (DONEPEZIL HCL) 1 by mouth once daily for memory loss  #30 x 0   Entered and Authorized by:   Jacques Navy MD   Signed by:   Jacques Navy MD on 04/23/2010   Method used:   Electronically to        [facility]. [phone]* (retail)       [address]       [place], [place]  [address]       Ph: [phone]       Fax: [phone]   RxID:   [id number]    Orders Added: 1)  TLB-Lipid Panel [80061-LIPID] 2)  TLB-Hepatic/Liver Function Pnl [80076-HEPATIC] 3)  TLB-B12 + Folate Pnl [[id number]-B12/FOL] 4)  TLB-TSH (Thyroid Stimulating Hormone) [84443-TSH] 5)  T-RPR (Syphilis) [38756-43329] 6)  Est. Patient Level IV [51884] 7)  Radiology Referral [Radiology]

## 2010-08-07 NOTE — Assessment & Plan Note (Signed)
Summary: CAD/ANAS   Visit Type:  1 yr f/u Primary Provider:  Norins  CC:  little sob at times...denies any other complaints today.  History of Present Illness: [name] comes in today for evaluation management of his coronary artery disease, history of chronic bypass grafting, history of hypertension, mixed hyperlipidemia, dyspnea, chronic lung disease, and pericardial effusion.  He stopped exercise because the gym would not let him wear jeans. He is not smoking.  He denies any angina but still has dyspnea on exertion. His last objective assessment of his coronary disease was in March of 2008.  He denies orthopnea, PND or edema. He says he's lost weight but I think he's gained.  He denies any palpitations or syncope. He states the compliant with his medications.  Clinical Reports Reviewed:  Cardiac Cath:  10/01/2006: Cardiac Cath Findings:   Left ventriculogram in the RAO position showed an EF of 60%, no [name]  motion abnormalities.   ASSESSMENT:  1. Severe two-vessel native coronary artery disease with a left-      dominant system.  2. The left internal mammary artery to the left anterior descending      artery is patent.  3. The saphenous vein graft to diagonal and the saphenous vein graft      to the left posterolateral are patent.  4. The saphenous vein graft to the obtuse marginal is totally occluded      with good flow in the native obtuse marginal.  5. There is normal LV function.  6. here is mild increase in pulmonary pressures without any tamponade      physiology.  7. Anterior chest mass on CT of uncertain etiology.   I have reviewed the films with CVTS and they have recommended admission  for a CT-guided needle aspiration of the mass rule out abscess or  recurrent lung cancer.  His coronary vascularization is intact.  We will  continue medical therapy for this.   Todd Buckles. Bensimhon, MD  Electronically Signed  Nuclear Study:  09/15/2006:  Excerise  capacity:  Blood Pressure response:  Clinical symptoms:  ECG impression: No significant ST segment chnage suggestive of ischemia  Overall impression: Normal stress nuclear study.  Todd Pates, MD   Current Medications (verified): 1)  Pravachol 40 Mg  Tabs (Pravastatin Sodium) .... Take 1 Tablet By Mouth Once A Day 2)  Meloxicam 15 Mg  Tabs (Meloxicam) .... Take 1 Tablet By Mouth Once A Day 3)  Mirtazapine 15 Mg  Tabs (Mirtazapine) .... Take 1 Tablet By Mouth Once A Day 4)  Amitriptyline Hcl 25 Mg Tabs (Amitriptyline Hcl) .... Take 1 Tab By Mouth At Bedtime 5)  Fluconazole 100 Mg Tabs (Fluconazole) .Marland Kitchen.. 1 By Mouth Once Daily  X 14 6)  Guiatuss Ac 100-10 Mg/61ml Syrp (Guaifenesin-Codeine) .... 5-10 Ml By Mouth Qid As Needed For Cough 7)  Aspirin 81 Mg Tbec (Aspirin) .... Take One Tablet By Mouth Daily  Allergies: 1)  ! 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  Past Surgical History: Last updated: 10/27/2008 SP CABG 2007 Thoracotomy R upper lobecotomy in 2006 Appendectomy...1945 Cholecystectomy....1975 Tonsillectomy..1942 Hemorrhoidectomy..1975  Family History: Last updated: 10/27/2008 Denies family hx of CAD Father: died at 12 .Marland KitchenAlzheimers Mother: died at 40 Family History of  Hyperlipidemia: Sister  Social History: Last updated: 10/27/2008 Single Divorced Alcohol use-yes Former Smoker Retired   Risk Factors: Alcohol Use: <1 (09/06/2009)  Risk Factors: Smoking Status: quit > 6 months (09/06/2009)  Review of Systems       negative other than history of present illness  Vital Signs:  Patient  profile:   77 year old male Height:      69.5 inches Weight:      177 pounds BMI:     25.86 Pulse rate:   66 / minute Pulse rhythm:   irregular BP sitting:   130 / 70  (left arm) Cuff size:   large  Vitals Entered By: [name], CMA (Nov 06, 2009 11:31 AM)  Physical Exam  General:  he is clearly gained weight. Head:  normocephalic and atraumatic Eyes:  PERRLA/EOM intact; conjunctiva and lids normal. Neck:  Neck supple, no JVD. No masses, thyromegaly or abnormal cervical nodes. Chest [name]:  no deformities or breast masses noted Lungs:  decreased breath sounds throughout without rales Heart:  nondisplaced PMI, normal S1-S2, soft systolic murmur. No rub Abdomen:  Bowel sounds positive; abdomen soft and non-tender without masses, organomegaly, or hernias noted. No hepatosplenomegaly. Msk:  Back normal, normal gait. Muscle strength and tone normal. Pulses:  pulses normal in all 4 extremities Extremities:  No clubbing or cyanosis. Neurologic:  Alert and oriented x 3. Skin:  Intact without lesions or rashes. Psych:  Normal affect.   EKG  Procedure date:  11/06/2009  Findings:      normal sinus rhythm, no change  Impression & Recommendations:  Problem # 1:  CORONARY ARTERY DISEASE (ICD-414.00) I will obtain a exercise stress Myoview. His been over 2 years since his last study not to mention he has dyspnea on exertion. He is also initiating getting back into an exercise program which I strongly encouraged. Orders: EKG w/ Interpretation (93000) Nuclear Stress Test (Nuc Stress Test)  His updated medication list for this problem includes:    Aspirin 81 Mg Tbec (Aspirin) .Marland Kitchen... Take one tablet by mouth daily  Problem # 2:  DYSPNEA (ICD-786.05)  Orders: Nuclear Stress Test (Nuc Stress Test)  His updated medication list for this problem includes:    Aspirin 81 Mg Tbec (Aspirin) .Marland Kitchen... Take one tablet by mouth daily  Problem # 3:  HYPERCHOLESTEROLEMIA (ICD-272.0) I have  reviewed his lipid panel from May of 2010. His LDL is at goal however he still has a low HDL 29 and triglycerides of 349. We will check labs when he returns for a stress test. His updated medication list for this problem includes:    Pravachol 40 Mg Tabs (Pravastatin sodium) .Marland Kitchen... Take 1 tablet by mouth once a day  Problem # 4:  HYPERTENSION (ICD-401.9)  His updated medication list for this problem includes:    Aspirin 81 Mg Tbec (Aspirin) .Marland Kitchen... Take one tablet by mouth daily  Patient Instructions: 1)  Your physician recommends that you schedule a follow-up appointment in: YEAR WITH DR [name] 2)  Your physician recommends that you return for lab work in:DAY OF MYOVIEW BMET LIPID LIVER FASTING 3)  Your physician recommends that you continue on your current medications as directed. Please refer to the Current Medication list given to you today. 4)  Your physician has requested that you have an exercise stress myoview.  For further information please visit [email or web address]  Please follow instruction sheet, as given.

## 2010-08-07 NOTE — Letter (Signed)
[facility] [address] [place], [place]  [address] Phone: [phone]      April 30, 2010   [facility] [address] [place], [place] [address]  RE:  LAB RESULTS  Dear  [name],  The following is an interpretation of your most recent lab tests.  Please take note of any instructions provided or changes to medications that have resulted from your lab work.  B12 normal, thyroid function normal, RPR (test for syphyllis) negative. CT brain with abnormality.   Please come see me if you have any questions about these lab results.   Sincerely Yours,    Jacques Navy MD  Patient: [name] Note: All result statuses are Final unless otherwise noted.  Tests: (1) Lipid Panel (LIPID)   Cholesterol               132 mg/dL                   2-956     ATP III Classification            Desirable:  < 200 mg/dL                    Borderline High:  200 - 239 mg/dL               High:  > = 240 mg/dL   Triglycerides             91.0 mg/dL                  2.1-308.6     Normal:  <150 mg/dL     Borderline High:  578 - 199 mg/dL   HDL                       46.96 mg/dL                 >29.52   VLDL Cholesterol          18.2 mg/dL                  8.4-13.2   LDL Cholesterol           74 mg/dL                    4-40  CHO/HDL Ratio:  CHD Risk                             3                    Men          Women     1/2 Average Risk     3.4          3.3     Average Risk          5.0          4.4     2X Average Risk          9.6          7.1     3X Average Risk          15.0          11.0  Tests: (2) Hepatic/Liver Function Panel (HEPATIC)   Total Bilirubin           0.9 mg/dL                   1.6-1.0   Direct Bilirubin          0.2 mg/dL                   9.6-0.4   Alkaline Phosphatase      62 U/L                      39-117   AST                       26 U/L                      0-37   ALT                       18 U/L                       0-53   Total Protein             7.7 g/dL                    5.4-0.9   Albumin                   4.0 g/dL                    8.1-1.9  Tests: (3) B12 + Folate Panel (B12/FOL)   Vitamin B12               884 pg/mL                   211-911   Folate                    3.7 ng/mL     Deficient  0.4 - 3.4 ng/mL     Indeterminate  3.4 - 5.4 ng/mL     Normal  >5.4 ng/mL  Tests: (4) TSH (TSH)   FastTSH                   3.29 uIU/mL                 0.35-5.50 Tests: (1) RPR Reflex to T.pallidum Ab, Total (14782)   RPR                       NON REAC                    NON REAC  CT HEAD W/O CM - [id number]   Clinical Data: Memory loss.   CT HEAD WITHOUT CONTRAST   Technique:  Contiguous axial images were obtained from the base of the skull through the vertex without contrast.   Comparison: 08/14/2007   Findings: Generalized brain atrophy, appropriate for age.  There is accelerated atrophy in the temporal lobes bilaterally.  Negative for hemorrhage or mass.  Negative for acute infarct.  Calvarium is intact.  Mild arterial calcification in the carotid arteries bilaterally.   IMPRESSION: Mild atrophy, which is more significant in the temporal lobes.  No acute abnormality.   Read By:  Camelia Phenes,  M.D. 

## 2010-10-22 LAB — DIFFERENTIAL
Basophils Relative: 0 % (ref 0–1)
Eosinophils Absolute: 0.1 10*3/uL (ref 0.0–0.7)
Eosinophils Relative: 1 % (ref 0–5)
Lymphs Abs: 0.3 10*3/uL — ABNORMAL LOW (ref 0.7–4.0)
Monocytes Relative: 9 % (ref 3–12)

## 2010-10-22 LAB — CBC
Platelets: 74 10*3/uL — ABNORMAL LOW (ref 150–400)
WBC: 5.6 10*3/uL (ref 4.0–10.5)

## 2010-10-22 LAB — BLOOD GAS, ARTERIAL
Acid-Base Excess: 0.4 mmol/L (ref 0.0–2.0)
Bicarbonate: 22.5 mEq/L (ref 20.0–24.0)
O2 Saturation: 92.9 %
Patient temperature: 100.6
pO2, Arterial: 64.6 mmHg — ABNORMAL LOW (ref 80.0–100.0)

## 2010-10-22 LAB — COMPREHENSIVE METABOLIC PANEL
AST: 33 U/L (ref 0–37)
Albumin: 3.5 g/dL (ref 3.5–5.2)
Alkaline Phosphatase: 64 U/L (ref 39–117)
Chloride: 104 mEq/L (ref 96–112)
GFR calc Af Amer: >60 mL/min (ref >60)
Potassium: 3.9 mEq/L (ref 3.5–5.1)
Total Bilirubin: 1.6 mg/dL — ABNORMAL HIGH (ref 0.3–1.2)

## 2010-10-22 LAB — CARDIAC PANEL(CRET KIN+CKTOT+MB+TROPI)
CK, MB: 0.5 ng/mL (ref 0.3–4.0)
CK, MB: 0.6 ng/mL (ref 0.3–4.0)
Relative Index: INVALID (ref 0.0–2.5)
Relative Index: INVALID (ref 0.0–2.5)
Total CK: 56 U/L (ref 7–232)
Total CK: 61 U/L (ref 7–232)
Troponin I: 0.01 ng/mL (ref 0.00–0.06)

## 2010-10-22 LAB — CULTURE, BLOOD (ROUTINE X 2)

## 2010-11-20 NOTE — Consult Note (Signed)
[name], [name]             ACCOUNT NO.:  [id number]   MEDICAL RECORD NO.:  [id number]          PATIENT TYPE:  INP   LOCATION:  4704                         FACILITY:  [facility]   PHYSICIAN:  Noralyn Pick. Eden Emms, MD, FACCDATE OF BIRTH:  1931/05/07   DATE OF CONSULTATION:  08/14/2007  DATE OF DISCHARGE:  08/15/2007                                 CONSULTATION   [name] was admitted from the office today by Dr. Debby Bud' service.  We are asked to see him regarding the possibility of a subacute  bacterial endocarditis   The patient has had a previous CABG in 1997 in [place].  This  included LIMA to the LAD, vein graft to diagonal, vein graft to the  obtuse marginal branch, and vein graft to PDA.   The patient also has a history of lung cancer back in 2006, with right  upper lobe resection.  Interestingly, back in March 2008, the patient  had a febrile illness.  He was noted to have a 4 x 3 cm fluid collection  anterior to the right ventricle.  He had a heart catheterization by Dr.  Gala Romney which showed no evidence of constriction or tamponade.  There  was a concern that this fluid represented either a late bleed from a  graft or an infection.  I do not know whether interventional radiology  or Dr. Donata Clay drained it, but the patient describes a procedure where  the anterior fluid collection was drained.  He said the fluid was clear.  Apparently, it was not an infection and probably represented a benign  seroma.  Since that time, the patient was readmitted on May 20, 2007, for rigors, chills, and fevers.  He was treated with a course of  Tamiflu.   The workup was apparently negative, and he was discharged home with  negative cultures.   The the patient subsequently has noted 2 weeks of rigors.  He says that  he takes Tylenol when his forehead feels warm and therefore has not  noted a fever.   He went to the dentist this Wednesday.  He actually had a rigor at  the  dentist's office, and the dentist felt he needed to be seen by the  [facility].  Incidentally, the patient went on to have a molar fixed  while at the dentist's office.  He has somewhat poor dentition and needs  a new bridge in the bottom teeth.   The patient has not noted any cough or sputum production.  There is been  no dysuria or focal signs of infection.  He has not had any significant  chest pain, PND, or orthopnea.   A 2D echocardiogram done September 15, 2006 showed that his LV function was  normal.  There was mild aortic valve insufficiency, with mild mitral  insufficiency.  Right ventricle was noted to be normal.   The echocardiogram in March 2008 and noted an echo-free space anterior  to the RV which was subsequently worked up by CT and percutaneous  drainage.   The patient's review of systems is otherwise negative.  The patient is allergic to [facility].  In actuality, after his surgery in  [place], it sounds like he had Clostridium difficile colitis.  The patient does not want to have any antibiotics unless there is a  documented problem.  He is very fearful.  Apparently, he had a fairly  prolonged course with severe Clostridium difficile colitis.   MEDICATIONS PRIOR TO ADMISSION:  1. An aspirin a day.  2. Pravachol 40 a day.  3. Tylenol as needed for shakes.  4. Iron.  5. Aleve.  6. Meloxicam.  7. Remeron for restless leg syndrome.   PAST MEDICAL HISTORY:  1. Some degree of pulmonary hypertension, with COPD.  2. He has had previous lung cancer, with resection of the right upper      lobe in 2006.  PET scanning done on February 27, 2007 showed no      residual evidence of tumor.  He is no longer a smoker  3. He has coronary disease, with previous CABG during his heart      catheterization done March 2008.  The vein graft to the obtuse      marginal branch was occluded, but everything else was fine.   The patient is divorced.  He is originally from  [place].  He has  two children.  He quit smoking in 2005.  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  There is also some question of  cirrhosis, but he denies heavy drinking now.   FAMILY HISTORY:  Remarkable for no premature coronary artery disease.   REVIEW OF SYSTEMS:  Primarily remarkable for some degree of tachycardia  and rigors, otherwise negative.   PHYSICAL EXAMINATION:  GENERAL:  Remarkable for a healthy-appearing,  middle-aged white male in no distress.  VITAL SIGNS:  The nurse tells me he is not febrile, although there are  no vitals in the chart.  He has sinus rhythm at a rate of 90, blood  pressure is 128/70, respiratory rate is 14.  HEENT:  Unremarkable.  There is no lymphadenopathy, thyromegaly, or JVP  elevation.  LUNGS:  Clear.  CHEST:  He has an anterior thoracotomy scar from his lung resection.  HEART:  There is an S1, S2.  I do not hear rub or any obvious murmurs.  PMI is normal.  ABDOMEN:  Benign.  Bowel sounds are positive.  No AAA.  No tenderness,  no bruit.  There is a large cholecystectomy scar.  EXTREMITIES:  Distal pulses are intact, with no edema.  SKIN:  Shows no stigmata of SBE.  NEUROLOGIC:  Nonfocal.  MUSCULOSKELETAL:  There is no muscular weakness.   IMPRESSION:  1. Recurrent rigors for 2 weeks.  Recent dental manipulation of a      molar.  Baseline mild AR and MR.  Also, history of anterior      pericardial fluid collection.  It is not clear to me that the      patient is having bacteremia.  Blood cultures have been drawn.  The      patient will be very reticent to have antibiotics started, given      his history of Clostridium difficile colitis.  We will start with a      transthoracic echocardiogram.  we should be able to get some idea      of his valves as well as whether or not this anterior fluid      collection has returned.  I think that this probable  seroma needs      to be reevaluated with MR or CT to make sure  the fluid collection      has not changed in character or grown.  If the patient continues to      have rigors, an ID consult may be in order.  Routine lab work      including a sed rate and CBC should be drawn.  2. History of coronary disease.  Previous CABG, with failed graft to      the OM, with no angina.  Continue aspirin therapy.  Interestingly,      the patient does not appear to be on a beta blocker, and given the      relative tachycardia this may be worthwhile starting.  3. Hypercholesterolemia in the setting of bypass surgery in 2007.      Continue Pravachol 40 mg a day.  Lipid and liver profile in 6      months.  4. Restless leg syndrome.  Continue Remeron 15 mg at bedtime.  5. History of lung cancer.  No obvious recurrence.  PET scan done      February 27, 2007 showing no evidence of metabolically active lung      disease, currently stable.   We would be happy to follow the patient along.  His course will depend  on whether his blood cultures turn positive, the baseline results of his  transthoracic echocardiogram, and followup of his pericardial ceroma  with either MR or CT.      Noralyn Pick. Eden Emms, MD, [facility]  Electronically Signed     PCN/MEDQ  D:  08/14/2007  T:  08/17/2007  Job:  [id number]

## 2010-11-20 NOTE — H&P (Signed)
NAMETRAMOND, SLINKER             ACCOUNT NO.:  [id number]   MEDICAL RECORD NO.:  [id number]          PATIENT TYPE:  INP   LOCATION:  1235                         FACILITY:  [facility]   PHYSICIAN:  Therisa Doyne, MD    DATE OF BIRTH:  02/12/1931   DATE OF ADMISSION:  05/19/2007  DATE OF DISCHARGE:                              HISTORY & PHYSICAL   [place] [facility]PITAL[facility] HISTORY AND PHYSICAL:   PRIMARY CARE Alee Gressman:  Rosalyn Gess. Norins, M.D.   CHIEF COMPLAINT:  Rigors, chills and fevers.   HISTORY OF PRESENT ILLNESS:  Patient is a 77 year old white male with  the past medical history significant for COPD and coronary artery  disease, status post coronary artery bypass grafting, who presents to  the emergency department with the acute onset of fevers, rigors and  chills.  The patient states that his symptoms began abruptly at 7 p.m.  this evening.  He has had some associated rhinorrhea today, as well as  associated right-ear pain.  He notes myalgias this evening, all of which  have been acute in onset.  His fever has been as high as 103 degrees,  Fahrenheit.  He denies any chest pain, shortness of breath, cough,  nausea or vomiting, vomiting or diarrhea.  He came to the emergency  department for evaluation.  In the emergency department, he received  normal saline, Avelox, albuterol and Atrovent.  Of note, the patient did  not receive his flu shot this flu season.   PAST MEDICAL HISTORY:  1. Coronary artery disease, status post coronary artery bypass      grafting in September of 2007.  2. COPD.  3. History of non-small-cell lung cancer, status post right upper      lobectomy in February of 2006.   SOCIAL HISTORY:  Patient lives alone at home.  He does drink wine, but  denies tobacco.   FAMILY HISTORY:  The patient denies family history of coronary artery  disease or cancer.   ALLERGIES:  KEFLEX.  However, the patient can take penicillin.   MEDICATIONS:  1. Aspirin 81 mg  daily.  2. Iron.  3. Pravachol 40 mg daily.  4. Meloxicam 15 mg daily.  5. Mirtazapine 15 mg q.h.s.   REVIEW OF SYSTEMS:  All other systems were reviewed and negative except  as mentioned above, in History of Present Illness.   PHYSICAL EXAM:  VITAL SIGNS:  Temperature 102.7 degrees rectally.  Blood  pressure 136/61, pulse 130, respirations 24, oxygen saturation 95% on  room air.  GENERAL:  No acute distress.  HEENT:  Normocephalic, atraumatic.  Oropharynx pink, moist, without any  lesions.  The tympanic membrane is clear on the left side.  However, in  the right ear, there is some mild fluid behind the right ear.  CARDIOVASCULAR:  Tachycardic with regular rhythm, no murmurs, rubs or  gallops.  CHEST:  Clear to auscultation bilaterally.  ABDOMEN:  Positive bowel sounds, soft, nontender, nondistended.  EXTREMITIES:  No clubbing, cyanosis or edema.   LABORATORY STUDIES:  White blood cell count 4.1 with a left shift of 82%  segmented neutrophils,  hemoglobin 13.9, platelets 90.  CMP within normal  limits.  ABG revealed a pH of 7.46, pCO2 of 28, paO2 of 104 on an  unknown FiO2.  First set of cardiac enzymes was negative.   RADIOGRAPHIC STUDIES:  1. Chest x-ray:  No acute cardiopulmonary disease.  However, did      reveal COPD.  2. CT scan of the head revealed no acute intracranial abnormalities.   EKG showed sinus tachycardia at 133 beats per minute.  There is some  mild ST-segment depression in the anterior leads.   ASSESSMENT AND PLAN:  Patient is a 77 year old white male with past  medical history significant for COPD, coronary artery disease, who  presents with acute onset of fevers, up to 103 degrees, Fahrenheit,  rigors, chills and rhinorrhea with a negative chest x-ray.   1. We will admit the patient to the [facility] Service to the      telemetry unit.  2. Fever of unclear etiology.  However, at this time, I am suspicious      of a viral syndrome, specifically  of upper respiratory tract      infection, versus influenza.  He has not had a urinalysis yet and      his chest x-ray has been negative.  We will check a urinalysis with      urine culture.  We will check an influenza screen, as well as check      blood cultures.  We will continue him on empiric Avelox for now.      At this time, he has no meningismus on exam, so my suspicion for      meningitis is low.  However, I did counsel him that, should he      develop severe neck pain, headache or vision changes, he should let      [place] know immediately.  3. For his tachycardia, it is likely related to his fever.  We will      volume-resuscitate him with normal saline at 150 mL per hour and      give him Tylenol as needed.  4. Coronary artery disease.  Continue aspirin and Pravachol.  His      ejection fraction is normal on most recent echocardiogram, so I      feel he will be able to tolerate the normal saline.  He does have      some ST-segment changes on EKG, so we will rule him out for      myocardial infarction with serial cardiac enzymes.  I suspect this      is likely related and secondary to his underlying coronary disease      in the setting of his fever and tachycardia.  5. Chronic obstructive pulmonary disease, currently stable off all      treatment.  6. Fluids/nutrition:  Normal saline at 150 mL per hour.  Electrolytes      are stable.  Regular diet.  7. Prophylaxis:  For DVT prophylaxis, Lovenox.      Therisa Doyne, MD    SJT/MEDQ  D:  05/20/2007  T:  05/20/2007  Job:  [id number]   cc:   Rosalyn Gess. Norins, MD  [address]  [place]  [place] [address]

## 2010-11-20 NOTE — Discharge Summary (Signed)
[name], [name]             ACCOUNT NO.:  [id number]   MEDICAL RECORD NO.:  [id number]          PATIENT TYPE:  INP   LOCATION:  4704                         FACILITY:  [facility]   PHYSICIAN:  Sean A. Everardo All, MD    DATE OF BIRTH:  10/14/1930   DATE OF ADMISSION:  08/14/2007  DATE OF DISCHARGE:  08/15/2007                               DISCHARGE SUMMARY   REASON FOR ADMISSION:  Rigors.   HISTORY OF PRESENT ILLNESS:  This is a 77 year old man admitted by Dr.  Artist Pais on August 14, 2007 with an episode of rigors two days prior.  Please see his history and physical.   HOSPITAL COURSE:  The patient was admitted and blood cultures are  negative as of today, August 15, 2007.  He was seen in consultation by  cardiology who recommended transesophageal echocardiogram.  He was also  noted to have a sedimentation rate above 100.  The transesophageal  echocardiogram will need to be done early next week, as this technology  is not available on the weekend.  I discussed this fact with the  patient.  He stated he wanted to go home and do this as an outpatient.  I told him that I am extremely concerned about his rigors and elevated  sed rate.  I told him that infective endocarditis is a disease which is  fatal if untreated.  He responded that he would return to the hospital  if he felt worse.  I told him that it is possible he may not survive  long enough to go back to the hospital should he become ill at home.  With full knowledge of this and the understanding he risks unnecessary  death, he leaves the hospital against medical advise.  I have also told  him I would not know what antibiotic to prescribed for him so we would  have to await further results on the blood cultures and transesophageal  echocardiogram.   MEDICATIONS:  1. Zocor 20 mg daily.  2. Remeron 15 mg nightly  3. Iron sulfate 325 mg daily.  4. Aspirin 81 mg daily.   ACTIVITY:  The patient is advised to take it easy and not  return to work  until otherwise advised.   FOLLOW UP:  He will be called about an appointment for his  transesophageal echocardiogram.  He will also return to Dr. Debby Bud this  coming week.  No restriction on diet.      Sean A. Everardo All, MD  Electronically Signed     SAE/MEDQ  D:  08/15/2007  T:  08/17/2007  Job:  [id number]

## 2010-11-20 NOTE — Assessment & Plan Note (Signed)
[facility]                           PRIMARY CARE OFFICE NOTE   NAME:[name] [name] HOVATER                    MRN:          [id number]  DATE:11/04/2006                            DOB:          1930/10/24    [name] [name] was seen as a new patient on October 13, 2006.  Please see  that complete dictation.  This was reviewed with the patient and his  daughter for completeness and accuracy with no significant changes,  except to note the patient has a drug allergy to [facility].   In the interval since his last visit, this patient did have pulmonary  function studies and pulmonary consult.  This revealed the patient to  have moderate obstructive lung defect.  He also has severe decrease in  diffusing capacity consistent with smoking-related emphysema.  The  patient had what was thought to be an insignificant response to  bronchodilator therapy.  The patient was seen by Dr. Levy Pupa in  consultation.  The patient will need to have a follow up CT scan in  regards to a nodule and/or possible subcarinal node.  This will be  scheduled by Dr. Delton Coombes.   The patient was also seen in the interval by Dr. Juanito Doom on November 03, 2006 who felt the patient was stable from a cardiac perspective and  recommended follow up in 1 year.   The patient, since I have seen him, did have bilateral hip and back  films which showed significant osteoarthritic type changes, but nothing  that would require surgical intervention.  The patient did have an  excellent response to diclofenac and reports his symptoms have been very  well controlled.   The patient's daughter and the patient are asking whether he could be  tapered off of Effexor, given that his depression was situational post  surgery and rehabilitation, and that he is actually doing well at this  time.   CURRENT MEDICATIONS:  Unchanged from previous visit.   REVIEW OF SYSTEMS:  Negative for constitutional,  cardiovascular,  respiratory, GI or GU complaints at this moment.   PHYSICAL EXAMINATION:  VITAL SIGNS:  Temperature was 96.7, blood  pressure 128/58, pulse 59, weight 177.  GENERAL APPEARANCE:  This is a well-nourished gentleman looking his  stated age who looks remarkably well, given his medical history.  HEENT:  Normocephalic and atraumatic.  External auditory canals and  tympanic membranes are unremarkable.  Oropharynx without lesions.  Posterior pharynx was clear.  No scarring was noted in the oral cavity,  given that the patient had had a dental excision of a malignancy.  Posterior pharynx was clear.  Conjunctivae and sclerae were clear.  Pupils are equal, round and reactive to light and accommodation.  Extraocular movements intact.  Funduscopic exam with a hand-held  instrument revealed normal disk margins with no vascular abnormalities.  NECK:  Supple without thyromegaly.  NODES:  No adenopathy was noted in the cervical, supraclavicular or  inguinal regions.  CHEST:  The patient has no deformities.  He has a well-healed sternotomy  scar.  LUNGS:  Clear with no rales, wheezes,  or rhonchi.  CARDIOVASCULAR:  2+ radial pulse.  No JVD or carotid bruits.  He had a  quiet precordium with regular rate and rhythm without murmurs, rubs, or  gallops.  ABDOMEN:  Soft.  No guarding or rebound.  No organosplenomegaly was  noted.  The patient has multiple surgical scars which are well healed.  GU:  The patient had a circumcised phallus.  He has bilaterally  descended testicles without masses.  RECTAL:  Normal sphincter tone.  Prostate was very firm without specific  nodules.  EXTREMITIES:  Without clubbing, cyanosis, edema or deformity.  NEUROLOGIC:  Grossly nonfocal.   LABORATORY DATA:  No new laboratories are available.   ASSESSMENT AND PLAN:  1. Cardiovascular - stable and released by Dr. Daleen Squibb for 1 year.  2. Pulmonary - the patient is being evaluated by Dr. Delton Coombes.  Please      see  his notes.  The patient does have severe emphysema.  This was      explained to the patient in detail and adamantly encouraged him to      not smoke.  The patient actually has been abstaining since his      surgery and doing well.  3. Lipids - well controlled.  4. Musculoskeletal - the patient with significant degenerative joint      disease with pain in his hip and back now controlled with      diclofenac.  The patient will continue the same.  5. Psychosocial - The patient seems to be stable and doing well.  Will      have him taper off of Effexor, and a schedule was given to him      where he will be on medication for 2 days, off 1 for 2 cycles, then      on and off every other day for 3 cycles, then one 1, off 2 for 3      cycles, and then discontinue medication.   The patient with multiple medical problems who does seem medically  stable at this time.  He is asked to return to see me for follow up in 3  months.     [name], MD  Electronically Signed    MEN/MedQ  DD: 11/05/2006  DT: 11/05/2006  Job #: [id number]   cc:   Caprice Kluver

## 2010-11-20 NOTE — Assessment & Plan Note (Signed)
[facility]                             PULMONARY OFFICE NOTE   NAME:[name]                    MRN:          [id number]  DATE:02/25/2007                            DOB:          11-07-1930    SUBJECTIVE:  [name] is a 77 year old gentleman with a history of  tobacco abuse, COPD, non-small cell lung cancer status post right upper  lobectomy.  We have been following a left upper lobe nodule that has  been fairly stable by CT scans of the chest.  He had a scan performed in  May of this year that showed some questionable enlargement of that  nodule, and a repeat scan has been performed on February 13, 2007.  He is  here to review the results of that test today.  This study showed a  stable 6-mm left upper lobe nodule without any new nodules identified.  There was some interval slight improvement in his mediastinal  adenopathy.  It was recommended that possibly a PET scan of the chest  might give more information about his adenopathy and his nodule.  Otherwise, [name] denies any significant complaints.   MEDICATIONS:  Unchanged from his previous visit.  He continues to use  Spiriva as directed by Dr. Debby Bud.   PHYSICAL EXAMINATION:  LUNGS:  Clear to auscultation bilaterally.  He  has some decreased right upper lobe breath sounds.  VITAL SIGNS:  Stable.   Otherwise, exam is unchanged from previous visits.   IMPRESSION:  1. Chronic obstructive pulmonary disease.  2. Left upper lobe nodule in the setting of prior non-small cell lung      cancer.  His CT scan of the chest has been for the most part stable      with some waxing and waning in the size of his mediastinal      adenopathy.  For this reason, I would like to refer him for PET      scan of the chest to ensure that he has not had a recurrence.  I      will follow up with Dr. Loney Loh after his PET scan has been      completed.     Leslye Peer, MD  Electronically  Signed    RSB/MedQ  DD: 03/16/2007  DT: 03/17/2007  Job #: [id number]

## 2010-11-20 NOTE — Discharge Summary (Signed)
[name], [name]             ACCOUNT NO.:  [id number]   MEDICAL RECORD NO.:  [id number]          PATIENT TYPE:  INP   LOCATION:  1235                         FACILITY:  [facility]   PHYSICIAN:  Rosalyn Gess. Norins, MD  DATE OF BIRTH:  Jul 09, 1930   DATE OF ADMISSION:  05/19/2007  DATE OF DISCHARGE:  05/20/2007                               DISCHARGE SUMMARY   ADMITTING DIAGNOSIS:  Rigors, fever and chills.   DISCHARGE DIAGNOSIS:  Viral syndrome with fever.   HISTORY OF PRESENT ILLNESS:  The patient is a 77 year old gentleman with  a history of COPD, coronary artery disease, known history of lung cancer  treated.  He presented to the emergency department with the acute onset  of fevers to 103 Fahrenheit with rigors.  He reported this began about  1900 hours.  He had positive rhinorrhea.  He had some discomfort in his  right ear.  He had diffuse myalgias.  The patient has had no increased  shortness of breath, no chest pain, no cough, no nausea, no vomiting, no  diarrhea.  The patient was admitted because of his high fever for  potential respiratory infection.   Please see the H&P for past medical history, family history and social  history.   HOSPITAL COURSE:  The patient was admitted to the step-down unit.  Laboratories were obtained revealing a white count of 4100 with 82%  segs, 12% lymphs, 2% monos.  Hemoglobin was 13.9 g.  Chemistries  revealed sodium 138, potassium 3.7, chloride 102, CO2 22, BUN was 18,  creatinine 1.15, glucose was 95.  Cardiac enzymes were negative x2.  UA  was negative.  Chest x-ray revealed no active disease, specifically no  airspace disease, but changes of chronic COPD/emphysema.  CT scan of the  brain was performed which showed no active disease.   During the hospital day the patient was doing well.  His temperature  came down to 98.2 this a.m. and at time of discharge dictation was 99.1.  The patient has had no recurrent rigors.  He has had no  respiratory  distress or difficulty.  He has been able to ambulate.  He has been able  to keep down food and fluids.   With the patient's symptoms having stabilized, with fever having come  down, with him having a normal white count and normal chest x-ray, it is  felt he has a significant viral syndrome.  He was tested for influenza A  and B.  These tests were negative.  However, I still feel he may have a  viral syndrome or atypical influenza.   PLAN:  The patient is discharged home.  He will take Tamiflu 75 mg  b.i.d.  He is to take acetaminophen 1000 mg t.i.d. on a scheduled basis.  He is to hydrate.  He is to take vitamin C.  The patient was carefully  instructed if he has  recurrent symptoms or a change in his condition he needs to call for  urgent evaluation.  I have asked him to call and leave a message with my  office staff on Friday to  make sure he is stable and recovering.   The patient's condition at time of discharge dictation is stable and  improved.      [name], MD  Electronically Signed     MEN/MEDQ  D:  05/20/2007  T:  05/21/2007  Job:  [id number]

## 2010-11-20 NOTE — Assessment & Plan Note (Signed)
[facility]                                 ON-CALL NOTE   NAME:CALABRIAChriss, Mannan                      MRN:          [id number]  DATE:05/19/2007                            DOB:          [birth year]    TIME:  At 9:53 p.m.   PHONE NUMBER:  [phone]   CALLER:  Xzander Gilham, the daughter.   OBJECTIVE:  The patient has fever and shaking. He is a 77 year old white  male who moved here from [place] last year with a four vessel bypass at  that time. Now, sees Dr. Daleen Squibb in Cardiology and is cared for Dr. Debby Bud  as a primary care Brenlyn Beshara. This started from this morning with burning  up with fever, pain in his ear, neck and head. Daughter wants to know  what should be done. I suggested the patient be taken to the emergency  room to be evaluated as it does not sound like they ought to wait until  tomorrow.   Primary care Tashea Othman is Dr. Debby Bud and home office is [facility]. She said  she would take him to [facility] to be seen.     Arta Silence, MD  Electronically Signed    RNS/MedQ  DD: 05/19/2007  DT: 05/20/2007  Job #: [id number]

## 2010-11-20 NOTE — Assessment & Plan Note (Signed)
[facility]                           PRIMARY CARE OFFICE NOTE   NAME:[name], [name]                    MRN:          [id number]  DATE:11/04/2006                            DOB:          26-Jun-1931    ADDENDUM:  In taking a history from the patient with his daughter's  help, it turns out the patient has started to have wine with his meals.  He says he is limiting himself to one glass a day.   The patient does have a history of significant alcohol abuse problems. I  emphasized to him the importance of abstinence from alcohol in regards  to his history and to avoid problems with recurrent alcoholism and  alcohol abuse. I believe the patient heard me on this. His daughter was  present during this conversation.     [name], MD  Electronically Signed    MEN/MedQ  DD: 11/05/2006  DT: 11/05/2006  Job #: [id number]

## 2010-11-20 NOTE — Assessment & Plan Note (Signed)
[facility]                                 ON-CALL NOTE   NAME:CALABRIAJerrit, Horen                      MRN:          [id number]  DATE:10/23/2006                            DOB:          1930/09/19    DATE OF INTERACTION:  October 23, 2006 at 6:13 p.m.   PHONE NUMBER:  [phone].   CALLER:  Trula Ore, the daughter.   OBJECTIVE:  The patient has had fever since late this afternoon, unknown  amount.  Appetite has been decreased somewhat lately.  Shortness of  breath was reported earlier, but now better.  Has no other real  symptoms.  Daughter was wondering what to do for treatment.  Assuming  that nothing else is going on, I told her to give the patient regular  Tylenol overnight and see how he responds.  If things worsen at any  time, especially if the breathing worsens, to take him to the emergency  room for evaluation.  If he does well overnight, but still has fever in  the morning or feels worse, call the office for an appointment to be  seen.   PRIMARY CARE PHYSICIAN:  Dr. Debby Bud.  Home office is [facility].     Arta Silence, MD  Electronically Signed    RNS/MedQ  DD: 10/23/2006  DT: 10/24/2006  Job #: [id number]

## 2010-11-20 NOTE — Assessment & Plan Note (Signed)
[facility]                            CARDIOLOGY OFFICE NOTE   NAME:Isip, MOUSTAPHA TOOKER                    MRN:          [id number]  DATE:11/03/2006                            DOB:          October 05, 1930    [name] returns today for the following issues:   PROBLEMS:  Coronary artery disease, status post coronary artery bypass  grafting in November, 2007.  Left internal mammary artery grafted to the  LAD, vein graft to a diagonal, vein graft to an obtuse marginal, and the  circumflex branch.  Recent catheterization showed a patent LIMA to the  LAD, patent vein graft to a diagonal, and patent vein graft to a  posterolateral branch.  The vein graft to the obtuse marginal was  totaled.  His EF was 60%.  Right heart cath for dyspnea on exertion and  anterior mediastinal fluid collection with question of some compression  by CT, showed no significant of compression.  His right atrial pressure  was a mean of 7, right ventricular pressure of 40/2, PA pressure of  41/13, mean 25, pulmonary wedge pressure of 15, pulmonary vascular units  were 2.0 wood units.  There was no evidence of constriction by RV and LV  pressure tracings.   He underwent evaluation by Dr. Kathlee Nations Trigt and also interventional  radiology.  I do not have the note, but apparently he had interventional  radiology perform a percutaneous drainage procedure.  They removed two  large tubes of bloody fluid, according to the patient.  All the analysis  was apparently negative, as reviewed by Dr. Donata Clay.  Again, I do not  have those results.   Since that time, he still has significant dyspnea on exertion.  Some of  this may be his lung disease, as he drops his O2 sats down to the 88%  range with walking.   He is still in cardiac rehab.   In looking at his numbers, he does not increase his heart rate very much  from exercise, going from only the 60s up to about 80-90 beats per  minute.   His pressure is also not going up very much with exercise.  He  is very limited.   MEDICATIONS:  1. Multivitamin with iron.  2. Aspirin 81 mg a day.  3. Atenolol 50 mg a day.  4. Effexor 25 mg a day.  5. Remeron 15 mg nightly.  6. Pravachol 40 mg nightly.   PHYSICAL EXAMINATION:  VITAL SIGNS:  His blood pressure is 126/62, his  pulse is 60 and regular.  His weight is 176.  GENERAL:  He is pale.  He looks chronically ill.  Very pleasant.  NECK:  No JVD.  Carotids are full.  There are no bruits.  Thyroid is not  enlarged.  Trachea is midline.  LUNGS:  Remarkable for decreased breath sounds throughout.  There are no  wheezes or rhonchi.  HEART:  No rub.  He has a normal S1 and S2.  ABDOMEN:  Soft with good bowel sounds.  EXTREMITIES:  No edema.  Pulses are intact.  NEUROLOGIC:  Intact.   ASSESSMENT:  Mr. Arzuaga has dyspnea on exertion.  He is really not  that much improved after drainage of this mediastinal fluid collection.  I am not surprised, seeing that his right heart cath did not show any  significant constriction or compression on the heart.  I think most of  his dyspnea on exertion is chronic obstructive pulmonary disease, a drop  in his saturations with exertion.  In addition, his heart rate and his  blood pressure are blunted to exercise.  Some of this may be atenolol.   PLAN:  1. Discontinue atenolol.  2. Continue cardiac rehab.  3. Assuming he is doing better and having no problems, see him back in      a year.     Thomas C. Daleen Squibb, MD, [facility]  Electronically Signed    TCW/MedQ  DD: 11/03/2006  DT: 11/04/2006  Job #: [id number]   cc:   Rosalyn Gess. Norins, MD

## 2010-11-20 NOTE — Assessment & Plan Note (Signed)
[facility]                           PRIMARY CARE OFFICE NOTE   NAME:[name], [name]                    MRN:          [id number]  DATE:01/01/2007                            DOB:          Aug 15, 1930    [name] was last seen November 04, 2006, please see that complete  dictation. He returns today because of progressive and increasing  shortness of breath. The patient also had requested a pulmonary  consultation. In reviewing the patient's chart, he actually saw Dr.  Delton Coombes October 16, 2006 for a pulmonary consultation, please see that  complete note. Dr. Delton Coombes was concerned for possible chronic obstructive  pulmonary disease/emphysema and had set the patient up for a pulmonary  function study. The patient also was to have a followup CT scan of the  chest to look for interval change in the nodule in the subcarinal area.  If his CT indicates that the node remains enlarged or has increased in  size, he may be a candidate for bronchoscopy and transbronchial Wang  needle biopsy or mediastinoscopy for better diagnosis.   The patient's pulmonary function studies were performed on April 10 and  revealed the patient to have moderate obstructive lung disease. He was  found the have severe diffusion capacity reduction and only a minimal  response to bronchodilators greater than 12%.   The patient had a CT scan of the chest early May that was ordered by Dr.  Donata Clay. This did indicate that there was an enlargement in the  subcarinal node of concern by approximately 2 mm. He also had a  persistent although smaller pericardial effusion. Also there were  changes on the CT scan that were consistent with air space disease,  specifically emphysema.   All of this information was reviewed with the patient. He does admit to  having increasing shortness of breath over the last 6 months. I  discussed with him the concept of hitting a tipping point in regards to  his  emphysema where he suddenly would become much more symptomatic. I  also shared with him my concern about the increasing size of the  subcarinal node that was seen on followup CT scan.   PHYSICAL EXAMINATION:  VITAL SIGNS:  Temperature was 97.5, blood  pressure 129/68, pulse 78, weight 179.  GENERAL:  This is a well-nourished, well-developed gentleman who is in  no acute distress. He has no obvious increased work of breathing at  rest. No further examination was conducted given he recently had a  complete physical exam, please see that note.   ASSESSMENT/PLAN:  Pulmonary. Patient with emphysema as established by  both imaging studies as well as decreased diffusion capacity on PFTs.  The patient did not have a significant response to bronchodilators and  therefore I would not add straight bronchodilator to his therapy.  However, we will give him a trial of Spiriva to see if he can have a  functional improvement in his exercise tolerance as well as a sense of  better well being.   The patient did have an increase in the size of the subcarinal  node of  concern and does need to return to Dr. Delton Coombes for evaluation of his  emphysema but also to carry through with the plan as outlined in his  original consult note for possible bronchoscopy with transbronchial  biopsy for a more definitive diagnosis.   The patient was shared this information. He was given a Spiriva sample.  He will continue on his other medications including aspirin, Pravachol,  etodolac. Of note, he has discontinued his psychotropic medications  including Effexor and Remeron. He will continue on Atenolol 50 mg daily.  He has discontinued diuretics.     [name], MD  Electronically Signed    MEN/MedQ  DD: 01/01/2007  DT: 01/02/2007  Job #: [id number]   cc:   Thomas C. Daleen Squibb, MD, [facility]  Leslye Peer, MD

## 2010-11-20 NOTE — Discharge Summary (Signed)
NAMEREAGEN, HABERMAN             ACCOUNT NO.:  [id number]   MEDICAL RECORD NO.:  [id number]          PATIENT TYPE:  OBV   LOCATION:  1411                         FACILITY:  [facility]   PHYSICIAN:  Rosalyn Gess. Norins, MD  DATE OF BIRTH:  08-Jun-1931   DATE OF ADMISSION:  07/25/2008  DATE OF DISCHARGE:  07/26/2008                               DISCHARGE SUMMARY   ADMITTING DIAGNOSES:  1. Rigors.  2. Shortness of breath.   DISCHARGE DIAGNOSES:  1. Rigors.  2. Shortness of breath, resolved.  3. Question of occult infection.   CONSULTANTS:  None.   PROCEDURES:  1. CT angiogram of chest revealed no evidence of acute PE,      postsurgical change in the right upper lobe, extensive      centrilobular emphysema more prominent on the left.  Stable 5 mm      right lower lobe pulmonary nodule and left upper lobe 6 mm nodule.  2. Acute abdominal series showing stable chronic lung changes without      acute pulmonary abnormality.  No acute abnormal abdominal findings.   HISTORY OF PRESENT ILLNESS:  The patient presented to the office with a  5-day history of fevers and rigors, shortness of breath with increased  respirations.  He had no cough, no rhinorrhea.  No chest pain, no  diarrhea, no nausea or vomiting except for an episode Friday night after  drinking a couple of glasses of wine.  He denied any focal pain except  for chronic pain in his right lower back.   At office admission the patient appeared ill with rigors, a low grade  fever, acrocyanosis, and was subsequently admitted for evaluation.  Please see the EMR H and P for past medical history, family history,  social history, and examination.   HOSPITAL COURSE:  The patient was admitted to a telemetry floor.  He had  imaging studies as noted which were unrevealing.  Laboratory revealed an  ABG on room air with pH 7.474, pCO2 at 31.3, pO2 at 64.6.  CBC with a  white count of 5600 with 84% segs, 6% lymphs, 9% monos, 1% eosinophils.  Hemoglobin was 12.1 g.  Comprehensive metabolic panel was unremarkable  except for a glucose of 123.  Electrolytes were normal.  Creatinine was  1.34.  Cardiac panel x2 was negative with a CK of 61.  Second of 56 with  normal troponin I.  TSH was just above normal at 5.194.   The patient's hospital time was uneventful.  He reports he was feeling  better.  Rocephin was ordered, but the patient had a KEFLEX allergy so  pharmacy held Rocephin and the patient was started on Avelox.  With  patient's laboratory being unrevealing and with the patient feeling  better, at this point I think he is safe for discharge home to complete  a course of Avelox for occult infection.   PLAN:  The patient to be discharged home.  He will resume all his prior  home medications without change.  He will be scheduled for a 2-D echo  for a valvular evaluation.  CONDITION AT TIME OF DISCHARGE:  Stable and improved.      [name], MD  Electronically Signed     MEN/MEDQ  D:  07/26/2008  T:  07/26/2008  Job:  [id number]

## 2010-11-20 NOTE — Assessment & Plan Note (Signed)
[facility]                            CARDIOLOGY OFFICE NOTE   NAME:[name], [name]                    MRN:          [id number]  DATE:01/14/2008                            DOB:          [birth year]    [name] returns today for followup of his coronary artery disease.   The last time I saw him, he was very frustrated at [facility], waiting  to have a procedure done for ruling out endocarditis.  He signed out  AMA.  Fortunately, he survived as I have joked with him today!   He is having no angina.  He does have dyspnea on exertion, but he has  chronic lung disease.  He has some chronic right-sided chest pain which  has been hard to characterize.   His past medical history is also significant for an anterior pericardial  fluid collection which was of uncertain etiology.  He had a mass effect  on the right ventricle.  This was drained by Dr. Kathlee Nations Trigt in  March 2008.   He has hyperlipidemia and is on pravastatin.  He takes an aspirin 81 mg  a day.   His exam today, his blood pressure is 148/78, his pulse 76 and regular,  and his weight is 181.  HEENT, normocephalic, atraumatic.  PERRLA.  Extraocular movements intact.  Sclera clear.  Facial symmetry is normal.  Dentition needs repair.  Carotid upstrokes were equal bilaterally  without bruits.  No JVD.  Thyroid is not enlarged.  Trachea is midline.  Lungs are remarkable for decreased breath sounds throughout.  There are  no rales.  There is no rub.  Heart reveals a nondisplaced PMI.  Normal  S1 and S2.  No rub.  Abdominal exam is soft.  Good bowel sounds.  No  midline bruit.  No hepatomegaly.  Extremities reveal no cyanosis,  clubbing, or edema.  Pulses are intact.  Neurologic exam is intact.   [name] is doing well.  His last stress Myoview was September 15, 2006.  It showed an EF of 69% with no ischemia.  He is having no symptoms of  angina at present.   It has been sometime since  he has had a lipid panel.   PLAN:  1. Lipid panel.  2. Comprehensive metabolic panel.  3. Continue pravastatin and aspirin.  4. See me back again in March 2010, at which time he will need a      stress Myoview.     Thomas C. Daleen Squibb, MD, [facility]  Electronically Signed    TCW/MedQ  DD: 01/14/2008  DT: 01/15/2008  Job #: [id number]

## 2010-11-20 NOTE — Assessment & Plan Note (Signed)
[facility]                             PULMONARY OFFICE NOTE   NAME:[name], [name]                    MRN:          [id number]  DATE:03/16/2007                            DOB:          07/23/1930    SUBJECTIVE:  The patient is a 77 year old man who follows up to discuss  the results of his recent PET scan and also discuss his pulmonary status  on Spiriva.  He tells me that his breathing is unchanged.  He does not  feel that the Spiriva has changed his exertional tolerance to any  significant degree.   CURRENT MEDICATIONS:  1. Aspirin 81 mg daily.  2. Iron once daily.  3. Pravachol 40 mg nightly.  4. Multivitamin once daily.  5. Spiriva 18 mcg one inhalation daily.   PHYSICAL EXAMINATION:  VITAL SIGNS:  His weight is 185 pounds,  temperature 98, blood pressure 122/60, heart rate 80, SpO2 95% on room  air.  GENERAL APPEARANCE:  This is a pleasant, elderly gentleman who is in no  distress on room air.  HEENT:  Benign.  LUNGS:  Clear to auscultation bilaterally with a few decreased breath  sounds in the right upper lobe.  CARDIOVASCULAR:  Regular without murmur.  ABDOMEN:  Soft, nontender and nondistended with positive bowel sounds.  EXTREMITIES:  No clubbing, cyanosis, or edema.  NEUROLOGIC:  Grossly nonfocal exam.   PET scan was performed on February 27, 2007.  This showed activity along  his sternotomy site that was postoperative in nature.  There was no  abnormal pulmonary activity.  He did have some emphysema that was noted.  His 5-6 mm left upper lobe nodule was noted.  This was not metabolically  active but the radiologist commented on the fact that it was probably  too small to be at the size threshold for detection by PET scan.  Localized anterior pericardial fluid was noted without any evidence of  malignancy.  His mediastinal lymphadenopathy did not show metabolic  activity.   IMPRESSION:  1. Chronic obstructive pulmonary disease  with mild air flow limitation      by pulmonary function tests.  He has not benefitted significantly      on Spiriva.  2. History of non-small-cell lung cancer, status post right upper      lobectomy with a left upper lobe nodule that is 6 mm in size and is      below the threshold for PET scan.  His mediastinal adenopathy was      not metabolically active.  No evidence for recurrent cancer was      noted on his PET scan of February 27, 2007.   PLAN:  1. I will stop his Spiriva at this time.  He will report back any      change in his symptoms and let me know if he wants to restart a      bronchodilator.  2. A CT scan of the chest with contrast in six months to follow his      left upper lobe nodule.  3. I will follow the patient in  six months after his scan or sooner      should he have any difficulty in the interim.     Leslye Peer, MD  Electronically Signed    RSB/MedQ  DD: 03/16/2007  DT: 03/17/2007  Job #: [id number]   cc:   Rosalyn Gess. Norins, MD  Jesse Sans. Wall, MD, [facility]

## 2010-11-23 NOTE — Assessment & Plan Note (Signed)
[facility]                            CARDIOLOGY OFFICE NOTE   NAME:Norkus, EMONI YANG                    MRN:          [id number]  DATE:09/29/2006                            DOB:          1930/12/11    [name] returns today for a follow up of an ovoid mass seen on his  2-D echocardiogram and then subsequent on a CT scan of the chest.   This is over the right ventricle and measures 46 x 31 millimeters along  the free wall.  CT scan clearly shows compression of the right ventricle  and diastole.  He has moderate pulmonary hypertension with estimated PAP  of 48.  His ejection fraction was 55%-60%.   I have discussed this case with Dr. Bevelyn Buckles. Bensimhon, Dr. Theron Arista C.  Nishan and Dr. Madolyn Frieze. Crenshaw who have reviewed the scans.  We feel  like the dyspnea on exertion could be related to this and we need to do  a right heart cath.   PROBLEM:  1. Coronary artery disease status post coronary bypass grafting x4 in      November of 2007.  2. Status post right upper lobe lung resection for lung cancer in      2006.  3. Hypertension.  4. Hyperlipidemia.  5. Former smoker.  6. Dyspnea on exertion.   MEDICATIONS:  1. Multivitamin with iron.  2. Aspirin 81 mg a day.  3. Atenolol 50 mg a day.  4. Effexor 25 mg a day.  5. Remeron 15 mg nightly.  6. Pravachol 40 mg a day.   He clearly is worse with his dyspnea on exertion than he was even post  bypass.   PHYSICAL EXAMINATION:  VITAL SIGNS:  His blood pressure today is 135/73,  his pulse 73 and regular, his weight is 174.  GENERAL:  He is in no acute distress.  HEENT:  Normocephalic, atraumatic, PERRLA, extra ocular muscles intact,  sclerae clear, facial symmetry is normal.  NECK:  Shows some minimal jugular venous distension at 30 degrees.  He  has no skussmaul's sign.  HEART:  Reveals a regular rate and rhythm, soft S1, S2, there is no rub.  LUNGS:  Clear.  ABDOMINAL:  Soft.  EXTREMITIES:   Reveal no edema, pulses are present.  NEURO:  Intact.   ASSESSMENT:  1. Extracardiac mass consistent either with blood, fluid, or clot.      This is clearly a postoperative finding.  It may be causing      significant right sided abnormalities and could be contributing to      his dyspnea on exertion.   Discussed at length indication for a right heart cath.  He agrees to  proceed.  We will have pre-cath labs done and scheduled it for Wednesday  with Dr. Bevelyn Buckles. Bensimhon.  Dr. Gala Romney is aware of the case.   Also get a sed rate to make sure this is not potentially an inflammatory  mass or infection.     Thomas C. Daleen Squibb, MD, [facility]  Electronically Signed    TCW/MedQ  DD: 09/29/2006  DT:  09/29/2006  Job #: [id number]

## 2010-11-23 NOTE — Assessment & Plan Note (Signed)
[facility]                           PRIMARY CARE OFFICE NOTE   NAME:[name], [name]                    MRN:          [id number]  DATE:10/13/2006                            DOB:          13-Feb-1931    [name] is a 77 year old gentleman who presents to establish for  ongoing continuity of primary care.  He was referred through the  courtesy of Maisie Fus C. Wall, MD.   CHIEF COMPLAINT:  1. Severe lower back pain.  The patient reports he is having severe      pain in the proximal anterior lower extremities.  He reports it is      difficult to stand.  It is hard to cross his legs.  He has      difficulty getting in and out of his porch.  He reports he has some      discomfort at night but also with sitting and standing and it is      very difficult to change positions.  2. Arthralgias.  The patient reports he has generalized arthralgias      affecting his shoulders, hands, wrists, and this has become worse      over the past 6 months.  3. Pericardial effusion.  Patient recently with cardiology evaluation      with 2 D echo, which revealed the patient to have a pericardial      effusion.  This was also seen on CT scan, which clearly showed some      compression of the right ventricle in end-diastole.  He was found      to have moderate pulmonary hypertension.  The patient did come to      cardiac catheterization performed October 01, 2006, by Bevelyn Buckles.      Bensimhon, MD.  This showed severe 2-vessel disease with a left-      dominant system.  The LIMA to the LAD was patent, SVG to the      diagonal and left posterolateral were patent.  SVG to obtuse      marginal was totally occluded with good flow in the native obtuse      marginal.  There was normal LV function.  There was mild increase      of pulmonary pressures without any tamponade physiology.  Anterior      chest mass CT of uncertain etiology.  The patient then to CT-guided  pericardiocentesis with evidently benign fluid resulting.  Final      pathology report on that is not available on the chart.   PAST MEDICAL HISTORY:  Surgical:  1. Tonsillectomy in 1942.  2. Appendectomy in 1945.  3. Hemorrhoidectomy in 1975.  4. Cholecystectomy in 1985.  5. Two-vessel coronary bypass surgery in 2007.  6. Thoracotomy with right upper lobectomy in 2006.  7. Catheterization followed by pericardiocentesis in 2008 as noted.   Medical illnesses:  1. The patient had the usual childhood diseases.  2. Hypertension.  3. 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  4. The patient has a remote history of tobacco abuse.  5. Remote history of alcohol abuse but has been abstemious for over a      year.  6. History of hyperlipidemia as noted.  7. History of dyspnea on exertion.  8. History of emphysema of a bullous nature by CT scan.   CURRENT HABITS:  Tobacco:  None.  Alcohol:  None.   The patient lists no drug allergies.   CURRENT MEDICATIONS:  1. Multivitamin with iron.  2. Aspirin 81 mg daily.  3. Atenolol 50 mg daily.  4. Effexor 25 mg daily.  5. Remeron 15 mg q.h.s.  6. Pravachol 40 mg q.h.s.   FAMILY HISTORY:  Father died at age 80.  He had Alzheimer disease.  Mother died at age 76.  There is no family history for lung cancer,  colon cancer, MI, diabetes.  He has a sister with hyperlipidemia.   SOCIAL HISTORY:  The patient is a Comptroller by training.  He  was an English as a second language teacher for [employer], working on [employer], and he continued on this position when it got  acquired by [employer].  The patient retired at age 9.  The patient was married for 20 years to a former [place],  divorced.  He was married for 4 years, divorced, and is single.  He has  a stepson and he has a  daughter, both live in [place].  The patient  is a sports car aficionado, having had a [place] GT-250 in the past,  currently driving a Porsche.  The patient has his own home.  He is  independent in his activities of daily living.   REVIEW OF SYSTEMS:  The patient has had no fevers, no chills, no  insomnia, no weight change.  The patient has had no visual change and is  up to date with ophthalmology but should have a visit soon.  The patient  does have partial dentures.  Cardiovascular disease as noted.  The  patient does have increased respiratory rate and some dyspnea on  exertion.  He reports he has had normal PFTs in the past but has not had  PFTs since his surgery.  Furthermore, he has not had diffusion capacity  tested and does carry the diagnosis of emphysema by CT scan.  The  patient has had no GI or GU complaints, no musculoskeletal complaints.  The patient does report he has had colorectal cancer screening.  I do  not have a record of that study.   CHART REVIEW:  Catheterization as noted.  Last stress Cardiolite study  September 15, 2006, showing no significant ST changes or significant  evidence of ischemia.  Last transthoracic echocardiogram from September 15, 2006, with normal left ventricle size, estimated ejection fraction 55-  60%.  No left ventricular wall motion abnormalities were noted.  The  left atrium was mildly dilated.  The intra-arterial septum bows from  right to left consistent with increased right atrial pressure.  The  right atrium was mildly dilated.  There was an echo-free space 46 x 31  mm along the free wall of the right ventricle, which appeared to be  extracardiac and was probably a pericardial effusion.   LIMITED EXAM:  VITAL SIGNS:  Temperature was 97, blood pressure 115/62,  pulse 76, weight 178.  GENERAL APPEARANCE:  A well-nourished, well-developed gentleman looking  younger than  his stated chronologic age, in no acute distress. HEENT:   Unremarkable.  The EACs had some cerumen but otherwise normal.  BACK:  The patient had some mild CVA tenderness in the lumbar spine  region.  MUSCULOSKELETAL:  The patient was able to rise and stand from a sitting  position without assistance.  He had normal gait.  He could toe-walk and  heel-walk without difficulty.  He was able to step up to the exam table  with either leg without difficulty.  He had 2+ DTRs at the patellar  tendon.  The patient had well-preserved sensation to light touch and  pinprick, slightly decreased deep vibratory sensation was noted.  He  could perform a straight leg maneuver in a sitting position with no  discomfort.  In the supine position the patient has significant  tenderness with external rotation of both hips.  EXTREMITIES:  Mild acrocyanosis and clubbing of the digits.  He had no  synovial thickening about any of the small, minor or major joints of the  hands, the wrists, elbows or shoulders.  He had good range of motion of  his shoulders with no crepitus.   ASSESSMENT AND PLAN:  1. Cardiovascular.  The patient is followed by Dr. Juanito Doom and does      seem stable with catheterization report as noted, and the patient      is being status post pericardiocentesis.  Will defer further      evaluation and follow-up to Dr. Daleen Squibb.  2. Degenerative joint disease.  Patient with marked significant pain      in his hips, which I think is his primary complaint in regard to      his limitation in activity.  I suspect this is either degenerative      joint disease or could be acute bursitis given its recent onset.  I      also suspect the patient has some degenerative joint disease of the      lower back; however, I doubt he has spinal stenosis given that his      pain is really in the groin and related more to hip movement.  The      patient does have a markedly elevated sedimentation rate as of      September 29, 2006, at 103, and I would be concerned that some of  his      arthralgias in his shoulders and upper back area might be related      to possible PMR.  Plan:  Patient to have x-rays of both hips      including frogleg views to rule out DJD.  X-rays are ordered of the      lumbar spine to rule out a loss of disk height and bony change.      The patient is prescribed diclofenac 500 mg b.i.d. for pain relief.      If the patient on this regimen continues to have upper shoulder      girdle discomfort as well as any lower hip girdle weakness, I would      consider him for a short course of prednisone for possible PMR      given his highly elevated sedimentation rate.  3. Lipids.  The patient's last lipid profile from August 05, 2006,      showed good control with a cholesterol of 117, triglycerides 92,      HDL 26.5, which is low, LDL of 72.  Plan:  Patient to continue  on      his present dose of Pravachol.  4. Pulmonary:  Patient with a long history of tobacco abuse, emphysema      with bullae by CT.  The patient reports he has had pulmonary     function tests in the past but not diffusion capacity.  Plan:      Patient scheduled for October 16, 2006, to have pulmonary function      tests pre- and post-bronchodilators, to include diffusion capacity,      to better assess his respiratory function and possibly explain his      dyspnea on exertion.  5. Health maintenance.  The patient's laboratories are basically      stable with normal hemoglobin, normal white count, normal serum      glucose, normal kidney function, normal liver functions.  The      patient will need to have a full physical exam at his convenience      to include a prostate exam.  The patient needs to provide some      information in regard to last colonoscopy to see if he is due for      follow-up study.   In summary, he is a very pleasant gentleman with problems as outlined  above.  I have asked him to return to see me in 2-3 weeks for a  consolidation visit, at which we can  review all these issues, review his  x-rays and his pulmonary function studies.     [name], MD  Electronically Signed    MEN/MedQ  DD: 10/13/2006  DT: 10/14/2006  Job #: [id number]   cc:   Lyn Henri

## 2010-11-23 NOTE — Assessment & Plan Note (Signed)
[facility]                            CARDIOLOGY OFFICE NOTE   NAME:[name], [name]                    MRN:          [id number]  DATE:08/01/2006                            DOB:          1931/04/06    [name] is a 77 year old divorced white male who comes to  [place], having moved from the [place], area.  He is referred by Dr. Jean Rosenthal of their group there.   He describes some atypical chest pain back in early September.  He had a  Persantine Cardiolite which showed questionable apical ischemia.   Heart catheterization on April 16, 2006, showed heavy calcified left  coronary system.  He was found to have a 50% lesion in the left main,  60% ostial LAD, 75-80% proximal lesion prior to the septum, moderate  sized diagonal with a 90% lesion, 30-40% distal LAD disease, large  dominant circumflex with a 90% OM-1, a PDA that was diffusely diseased  with 60-70% plaque, a nondominant small right coronary with a 90%  lesion.  He had normal left ventricular function with EF of 60%.  He  underwent coronary bypass grafting on May 28, 2006, with a left  internal mammary graft to the LAD, saphenous vein graft to a diagonal  branch LAD, first obtuse marginal and left inferior branch of the  circumflex.   He also has a history of right upper lobe lung resection for early lung  cancer.  He had no chemo or radiation.  This was in 2006.   He carries a diagnosis of hypertension, hyperlipidemia.   Since moving to [place], he has been doing well.  He still has some  dyspnea on exertion.  He has been in rehab for several months in [facility] and has done well.  He would like to join our rehab program at  [facility].   He did have to readmitted after surgery for what sounds like  pseudomembranous colitis from antibiotics.  In addition, he had to be re-  opened for bleeding after his initial surgery.   PAST MEDICAL HISTORY:   History of an appendectomy and cholecystectomy.  He has a history of alcohol abuse and depression.   His daughter is with him today, who has done a very good job of taking  care of him.   He is intolerant of KEFLEX.   He smoked two packs a day for 50 years.  He has a glass of wine  occasionally.  He drinks occasional coffee.   CURRENT MEDICATIONS:  1. Lasix 20 mg a day.  2. Multivitamin with iron daily.  3. Aspirin 81 mg a day.  4. Atenolol 50 mg a day.  5. Effexor 25 mg a day.  6. Remeron 15 mg q.h.s.  7. Pravachol 40 mg q.h.s.   FAMILY HISTORY:  Is really noncontributory.   SOCIAL HISTORY:  He is retired as of July 1997.  He is from [place]  originally.  He is divorced, has two children.  His daughter, [name],  is here today.   REVIEW OF SYSTEMS:  He has a history of  gout.  Otherwise, all is in the  HPI.   EXAMINATION TODAY:  VITAL SIGNS:  His blood pressure is 126/64, his  pulse is 75 and regular.  He is 5 feet 7 inches, weighs 168 pounds.  GENERAL:  He is chronically-ill-appearing.  Skin is warm and dry.  HEENT:  Sclerae are clear.  PERRLA, extraocular movements intact.  Facial symmetry is normal.  NECK:  Supple.  Carotid upstrokes are equal bilaterally without bruits.  There is no JVD.  Thyroid is not enlarged.  BACK:  Reveals a scar to the right upper shoulder from previous  resection of a mole as a child.  LUNGS:  Reveal decreased breath sounds throughout.  There are no  adventitious sounds, no rhonchi, no wheezes.  CARDIAC:  His sternotomy site is healing nicely.  There is no  instability.  His PMI is poorly appreciated.  He has normal S1, S2  without gallop.  ABDOMEN:  Soft and scaphoid.  There is no hepatomegaly, there is no  tenderness.  EXTREMITIES:  Reveal no cyanosis, clubbing or edema.  Pulses are present  bilaterally symmetrical.  NEUROLOGIC:  Intact.  SKIN:  Shows some ecchymoses.  He has marked clubbing and porcelain  nails of his upper  extremities.  There is no cyanosis.   His electrocardiogram shows normal sinus rhythm with some T-wave  inversion in V1 through V3.  His T-wave inversion in IV, V, and VI has  resolved.   ASSESSMENT AND PLAN:  Mr. Kloster is doing well at the present time.  I  spent about 30 minutes talking to he and his daughter about the long-  term game plan and what was done in [facility].  We will try to enroll  him at [facility] per his request.  I have asked him to  stop his Lasix.  I have asked him to continue his iron for at least 3  months. We will check a CBC, a comprehensive metabolic panel, a fasting  lipids.  I will plan on seeing him back again in the office in 3 months.   He would like to get a primary care physician here with [facility].  We  will refer him to the [facility] office to Dr. Debby Bud.     Thomas C. Daleen Squibb, MD, [facility]  Electronically Signed    TCW/MedQ  DD: 08/01/2006  DT: 08/01/2006  Job #: [id number]   cc:   Jetta Lout, M.D.

## 2010-11-23 NOTE — Assessment & Plan Note (Signed)
[facility]                            CARDIOLOGY OFFICE NOTE   NAME:[name], [name]                    MRN:          [id number]  DATE:09/02/2006                            DOB:          1930/12/14    This is a 77 year old divorced white male who just became established  with Dr. Daleen Squibb on August 01, 2006.  The patient had recently had CABG in  November of 2007 with a LIMA to the LAD, SVG to the diagonal, SVG to the  OM and left anterior branch of the circumflex.  He also has a history of  right upper lobe lung resection for early lung cancer in 2006.  He had  no chemotherapy or radiation.  The patient presents today complaining of  dyspnea on exertion.  He has had it for several months, and it is not  improving.  I asked him if he discussed it with Dr. Daleen Squibb in January, and  he says he did not.  He says that it has not worsened.  Dr. Daleen Squibb did  stop his Lasix when he saw him, and he says that his symptoms have not  gotten worse since the Lasix was stopped.  He says he gets out of breath  with very little activity.  He was in cardiac rehabilitation and states  his O2 saturations dropped to 88, and he gets quite short of breath with  just 10 minutes on the bike.  He denies any orthopnea, paroxysmal  nocturnal dyspnea.   CURRENT MEDICATIONS:  1. Multivitamin with iron.  2. Aspirin 81 mg daily.  3. Atenolol 50 mg daily.  4. Effexor 50 mg one-half daily.  5. Remeron 15 mg q.h.s.  6. Pravachol 40 mg q.h.s.   PHYSICAL EXAMINATION:  GENERAL:  This is a pleasant 77 year old white  male in no acute distress.  VITAL SIGNS:  Blood pressure 133/62, pulse 69, weight 171, up 3 pounds  since January.  NECK:  Without JVD, hepatojugular reflux, bruit or thyroid enlargement.  LUNGS:  Decreased breath sounds at bases, but they are clear anterior,  posterior and lateral.  HEART:  Regular rate and rhythm at 70 beats a minute.  Normal S1 and S2.  No murmur, rub,  bruit, thrill or heave noted.  ABDOMEN:  Soft without organomegaly, masses, lesions or abnormal  tenderness.  EXTREMITIES:  Without clubbing, cyanosis, or edema.  He has good distal  pulses.   IMPRESSION:  1. Dyspnea on exertion.  2. Coronary artery disease status post coronary artery bypass graft x4      in November of 2007.  3. Status post right upper lobe lung resection for early lung cancer      in 2006.  4. Hypertension.  5. Hyperlipidemia.  6. Former smoker.   PLAN AT THIS TIME:  I see no evidence of heart failure in this patient,  but we will check a BNP and BMET, as well as a 2-D echocardiogram to  assess his liver functions function and heart  failure status.  We will also order a stress Myoview to rule out  ischemia, and he will  follow up with Dr. Daleen Squibb next week.      Jacolyn Reedy, PA-C  Electronically Signed      Rollene Rotunda, MD, [facility]  Electronically Signed   ML/MedQ  DD: 09/02/2006  DT: 09/02/2006  Job #: [id number]   cc:   Thomas C. Wall, MD, [facility]

## 2010-11-23 NOTE — Assessment & Plan Note (Signed)
[facility]                            CARDIOLOGY OFFICE NOTE   NAME:Brule, REYN FAIVRE                    MRN:          [id number]  DATE:09/22/2006                            DOB:          1930/11/14    [name] comes back today to discuss his dyspnea on exertion.  Please see Mertie Clause note from September 02, 2006.   Note, that his sats do drop to 88% with walking.  This suggests  pulmonary right off the bat.   His stress Myoview showed an EF of 69% with no ischemia and normal  contractility.  His 2D echocardiogram showed EF 55-60%, normal left  ventricular chamber size and function, no important valvular  abnormalities, increased right sided pressures with a PAP of 48 mm,  moderate dilatation of the right atrium, atrial shift from right to left  consistent with an increased right atrial pressures.  He also had an  echo-free space 46x31 mm lying along the free wall of the RV.  I suspect  this is buckled pericardium.   He is still no better despite adding back his Lasix.  His BNP was  slightly high at 242 but this could be right sided failure.  His  chemistry was normal.   PHYSICAL EXAMINATION:  His blood pressure is 124/64, his pulse is 80 and  regular, his weight is 173.  His skin color is good.  His nails are  consistent for clubbing and purplish discoloration.  HEENT:  Normocephalic and atraumatic.  PERRLA.  Extraocular movements  intact.  Sclerae are clear.  NECK:  Carotid upstrokes equal bilaterally without bruits.  There is  minimal JVD lying at 30 degrees.  LUNGS:  Clear with decreased breath sounds throughout.  HEART:  A poorly appreciated PMI, normal S1, S2 without gallop.  There  is no right ventricular lift.  ABDOMEN:  Soft.  There is no hepatojugular reflux.  EXTREMITIES:  No edema, pulses are intact.   ASSESSMENT:  1. Dyspnea on exertion which is most likely related to his pulmonary      disease and also moderate  pulmonary hypertension.   He has this ovoid mass which is probably buckled pericardium.  We will  characterize this by CT.   PLAN:  1. CT scan.  2. Refer to pulmonary.  3. No change in his medications for now.  4. Follow up with me in 6 months.     Thomas C. Daleen Squibb, MD, [facility]  Electronically Signed    TCW/MedQ  DD: 09/22/2006  DT: 09/22/2006  Job #: [id number]   cc:   Leslye Peer, MD

## 2010-11-23 NOTE — Discharge Summary (Signed)
[name], DUNIVAN             ACCOUNT NO.:  [id number]   MEDICAL RECORD NO.:  [id number]          PATIENT TYPE:  INP   LOCATION:  2001                         FACILITY:  [facility]   PHYSICIAN:  Jesse Sans. Wall, MD, FACCDATE OF BIRTH:  Dec 16, 1930   DATE OF ADMISSION:  10/01/2006  DATE OF DISCHARGE:  10/02/2006                         DISCHARGE SUMMARY - REFERRING   DISCHARGE DIAGNOSES:  1. Dyspnea on exertion.  2. Progressive coronary artery disease with recent bypass surgery and      closure of the saphenous vein graft to his obtuse marginal.  3. Normal ejection fraction.  4. Anterior pericardial fluid collection with mass-effect on the right      ventricle of uncertain etiology by CT and echocardiogram.  5. History as noted below.   PROCEDURES PERFORMED:  Right and left cardiac catheterization by Dr.  Gala Romney in the [facility] cath lab on October 01, 2006.   SUMMARY OF HISTORY:  [name] is a 77 year old white male who was  established with Dr. Daleen Squibb in January 2008.  He presented to the office  on September 02, 2006, complaining of dyspnea on exertion for the  preceding several months.  However, he did not discuss this with Dr.  Daleen Squibb on his initial evaluation.  Dr. Daleen Squibb had stopped his Lasix in  January, and the patient states that his shortness of breath has not  become worse despite the Lasix being discontinued.  While on cardiac  rehab, his oxygen saturations dropped to 88%.  It was noted that he  became quite short of breath with just 10 minutes on a bicycle.  He  denied any orthopnea or PND.  It was felt that the patient should  undergo a stress Myoview for further evaluation of his symptoms.   PAST MEDICAL HISTORY:  1. Notable for bypass surgery in November 2007 with a LIMA to LAD,      saphenous vein graft to diagonal, and saphenous vein graft to the      OM and a left anterior branch of the circumflex.  2. His history is also notable for right upper lobe lung resection  for      early lung cancer in 2006.  He has not had any chemo or radiation      therapy.  3. He also has a history of hypertension, hyperlipidemia, and remote      tobacco use.   An echocardiogram was performed on September 15, 2006, and showed EF of 55%  to 60% without wall motion abnormalities.  There was mild flattening of  the interventricular septum during systole and diastole.  There was mild  AI and mild MR and moderate left atrial dilatation.  There was also  noted to be an echo free space, ovoid cystic-appearing, that measured  approximately 46 x 31 mm along the free wall of the right ventricle.  It  appears to be extracardiac and does not appear to inhibit filling.  Uncertain as to what this represents.  The patient was arranged to have  [facility]V cardiac catheterization.   LABORATORY DATA:  Chest CT on September 25, 2006, prior to his  admission,  showed an enlarged subcarinal lymph node.  Recommended follow up in 2 to  3 months.  Left upper lobe nodule.  Follow up in 6 months.  Splenomegaly.  There was also a large loculated anterior pericardial  fluid collection with mass-effect on the right ventricle.  This was  discussed with Dr. Vern Claude office.  Admission weight was 78.3 kg.  On the  24th, H&H was 12.2 and 34.1, normal indices, platelets 113, WBC 3.9.  Sed rate was elevated at 103.  PTT was 46, PT 14.1 with INR 1.3.  Sodium  143, potassium 3.9, BUN 15, creatinine 0.9, glucose 93. GFR was 88.  On  the 27th, H&H was 10.9 and 30.9, normal indices, platelets 98, WBC 3.4.  PT 17.2, PTT 46, and INR 1.4.   HOSPITAL COURSE:  Mr. [name] was brought in for right and left heart  catheterization in the [facility] cath lab.  This was performed by Dr. Gala Romney  on October 01, 2006.  The catheterization showed native 3-vessel coronary  artery disease.  The LIMA to the LAD was patent.  The saphenous vein  graft to diagonal one was patent.  The saphenous vein graft to the OM  was total.  Saphenous vein graft  to the circumflex left PL branch was  patent.  The right was small and not bypassed.  EF was 60%.  Dr.  Gala Romney recommended admission for CT-guided needle aspiration of mass.  These arrangements were made.  Initial plans were to have the procedure  performed on the 26th.  However, this was actually scheduled for March  27 with Radiology.  After a discussion between Dr. Gala Romney and Dr.  Edwyna Shell of Radiology.  However, it was noted that the patient had taken  his aspirin on September 30, 2006.  After discussing with Dr. Daleen Squibb and Gardner Candle, PA-C in Radiology, it was felt that the patient could be  discharged home to return as an outpatient to have a CT-guided biopsy  performed.  Prior to discharge, Dr. Donata Clay was consulted on October 01, 2006.  He felt that this collection was not related to bleeding post  CABG.  He felt that if CT-guided drainage was not successful, he would  need subxiphoid drainage.  Dr. Donata Clay commented that he doubted that  this was reoccurring lung cancer.  Once final arrangements were complete  to have this arranged as an outpatient, the patient was discharged home.   DISPOSITION:  The patient is discharged home.  He received supplemental  discharge sheet in regards to wound care and activities post  catheterization.  He was advised not to take his aspirin until  instructed to do so after CT-guided biopsy drainage.  He and his  daughter were informed that Rinaldo Cloud, the physician assistant with  radiology, will be contacting him at home to review the procedure and  date, time, and location with him and his family.  He was asked to  continue with atenolol 50 mg daily, Effexor 50 mg half a tablet daily,  Remeron 15 mg nightly, Pravachol 40 mg nightly, and nitroglycerin 0.4 as  needed.  He was also informed that Dr. Daleen Squibb will be contacting him in regard to results of CT-guided biopsy and further plan.  He does have an  appointment to see Dr. Daleen Squibb on October 15, 2006, at  2:15 p.m..  He also has  an appointment with Dr. Kriste Basque of [facility] to become  established with a primary care  physician.  He was also advised to bring  all medications to all appointments.  If he has any problems or  difficulties over the weekend, he was told to please contact us.  Discharge time was 50 minutes.      Joellyn Rued, PA-C      Jesse Sans. Daleen Squibb, MD, [facility]  Electronically Signed    EW/MEDQ  D:  10/02/2006  T:  10/02/2006  Job:  [id number]   cc:   Kerin Perna, M.D.  Lonzo Cloud. Kriste Basque, MD

## 2010-11-23 NOTE — Cardiovascular Report (Signed)
NAMEDEZMIN, KITTELSON             ACCOUNT NO.:  [id number]   MEDICAL RECORD NO.:  [id number]          PATIENT TYPE:  OIB   LOCATION:  NA                           FACILITY:  [facility]   PHYSICIAN:  Bevelyn Buckles. Bensimhon, MDDATE OF BIRTH:  24-Jun-1931   DATE OF PROCEDURE:  10/01/2006  DATE OF DISCHARGE:                            CARDIAC CATHETERIZATION   CARDIOLOGIST:  Maisie Fus C. Wall, MD,   PATIENT IDENTIFICATION:  [name] is a 77 year old male with a  history of lung cancer status post resection in 2006.  He also has  coronary artery disease which was found incidentally during a routine  stress test and underwent bypass surgery in November 2007at [facility].  Recently he has been more short of breath.  He denies chest  pain.  Echocardiogram showed a possible mass anterior to the RV.  He  underwent CT of the chest, which showed an ovoid mass compressing the  right ventricle.  He is referred for a diagnostic right and left heart  catheterization.   PROCEDURES PERFORMED:  1. Right heart catheterization.  2. Left heart catheterization.  3. Selective coronary angiography.  4. Saphenous vein graft angiography x3.  5. Left internal mammary artery angiography.  6. Left ventriculogram.   DESCRIPTION OF PROCEDURE:  The risks and benefits of the catheterization  were explained.  Consent was signed and placed on the chart.  The right  groin area was numbed with 1% local lidocaine.  A 4-French arterial  sheath was placed in the right femoral artery using a modified Seldinger  technique and a 7-French venous sheath was placed in the right femoral  vein using a similar technique.  A JL-4 was used for the left coronary  system, a 3-D RC catheter was used for the right coronary artery, the  saphenous vein graft to the OM and the saphenous vein graft to the  diagonal.  A right coronary bypass graft catheter was used for the  saphenous vein graft to the left PDA, an IM catheter was  used for the  left internal mammary artery.  All catheter exchanges were made over  wire.  There were no apparent complications.   Right atrial pressure was mean of 7, RV pressure 40/2, PA pressure 41/13  with a mean of 25.  Pulmonary capillary wedge pressure mean of 15.  Fick  cardiac output is 5.0, cardiac index was 2.6 L/min. Per sq. m.  Pulmonary vascular resistance was 2.0 Woods units.  Central aortic  pressure 148/63 with a mean of 96.  LV pressure 118/4 with an EDP of 13.  On simultaneous RV and LV pressure tracings, there was no evidence of  interaction both at baseline and after 400 mL fluid loading.  There is  no significant mitral stenosis or aortic stenosis on pressure tracings.   The left main had some minor irregularities.   LAD was a long vessel coursing to the apex.  It had an 80% ostial  stenosis and was diffusely diseased throughout the proximal to  midportion with heavy calcification.  The distal LAD was seen filling  with competitive flow.  There was a high diagonal with an 80% ostial  lesion.   Left circumflex was a large, dominant vessel that gave off a small to  moderate OM-1, a large OM-2, a large posterolateral and a large PDA.  There was a 40% diffuse lesion in the proximal portion of the  circumflex.  In the mid circumflex there was a 30-40% diffuse lesion,  and there was a 95% distal AV groove circumflex lesion before the  takeoff of the posterolateral and PDA.  The left circumflex was also  heavily calcified.  There was a 90% distal stenosis in the small OM-1, a  50% ostial stenosis in OM-2.  There was also a 90% stenosis in the  distal portion of the PDA.   Right coronary artery is a small, nondominant vessel with a 90% proximal  lesion and an 80% lesion in a moderate-sized RV branch.   The LIMA to the LAD was widely patent.  It filled the distal LAD well.  The saphenous vein graft to the OM was totally occluded.  The saphenous  vein graft to the left  posterolateral was widely patent and back-filled  the PDA.   The saphenous vein graft to the diagonal was widely patent.   Left ventriculogram in the RAO position showed an EF of 60%, no wall  motion abnormalities.   ASSESSMENT:  1. Severe two-vessel native coronary artery disease with a left-      dominant system.  2. The left internal mammary artery to the left anterior descending      artery is patent.  3. The saphenous vein graft to diagonal and the saphenous vein graft      to the left posterolateral are patent.  4. The saphenous vein graft to the obtuse marginal is totally occluded      with good flow in the native obtuse marginal.  5. There is normal LV function.  6. here is mild increase in pulmonary pressures without any tamponade      physiology.  7. Anterior chest mass on CT of uncertain etiology.   I have reviewed the films with [facility] and they have recommended admission  for a CT-guided needle aspiration of the mass rule out abscess or  recurrent lung cancer.  His coronary vascularization is intact.  We will  continue medical therapy for this.      Bevelyn Buckles. Bensimhon, MD  Electronically Signed     DRB/MEDQ  D:  10/01/2006  T:  10/01/2006  Job:  [id number]   cc:   Thomas C. Wall, MD, [facility]

## 2010-11-23 NOTE — Assessment & Plan Note (Signed)
[facility]                             PULMONARY OFFICE NOTE   NAME:[name], [name]                    MRN:          [id number]  DATE:10/16/2006                            DOB:          12/20/1930    REASON FOR CONSULTATION:  Asked by Dr. Daleen Squibb with cardiology to evaluate  [name] for exertional dyspnea.   BRIEF HISTORY:  [name] is a 77 year old man with coronary artery  disease status post CABG in November of 2007, hypertension, a right  upper lobectomy for nonsmall cell cancer performed in February of 2006  in [place] and recent drainage of a loculated anterior  pericardial effusion.  He is referred for well over 6 months of  insidious exertional shortness of breath.  He states that he has been  limited as to the amount of exertion and work he can perform.  He seldom  has cough.  He does not wheeze.  He denies any chest pain.  He does have  some pressure when he takes a deep breath.  He recently had pericardial  effusion drained as mentioned and did have some improvement in his  dyspnea after this was completed.  His dyspnea did not resolve  completely, however.  It was felt that his effusion was likely secondary  to his recent CABG.  Echocardiogram had revealed prior to drainage that  he did have some potential impact of his effusion on venous return and  pulmonary pressures, but there was no tamponade physiology and his LV  function was normal.  The pathology on that fluid is available today and  did not show any evidence of malignancy.   PAST MEDICAL HISTORY:  1. Hypertension.  2. Coronary artery disease status post CABG in November of 2007.  3. History of nonsmall cell cancer status post right upper lobectomy      in February 2006 by Dr. Carolan Shiver in [place].  4. Cholecystectomy in 1985.  5. Appendectomy 1945.  6. Hemorrhoidectomy.  7. Hypercholesterolemia.   ALLERGIES:  KEFLEX.   CURRENT MEDICATIONS:  1.  Multivitamin once daily.  2. aspirin 81 mg daily.  3. Atenolol 50 mg daily.  4. Effexor 25 mg daily.  5. Remeron 15 mg nightly.  6. Pravachol 40 mg nightly.   SOCIAL HISTORY:  The patient is divorced.  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  He states that he did not have any significant  inhalational exposures during that time because he was mostly in a  managerial position.  He has a long tobacco history with approximately  100 pack years.  He quit smoking in December of 2005.   FAMILY HISTORY:  Noncontributory.   REVIEW OF SYSTEMS:  As per the history of present illness, also please  note he has had some intermittent itching.   EXAMINATION:  GENERAL:  This is a well appearing elderly gentleman in  distress on room air.  Weight is 177 pounds, temperature 97.5, blood pressure 112/60, heart  rate 68, SPO2 97% on room air.  HEENT:  Benign.  LUNGS:  Have some mild left  lower lobe inspiratory crackles.  He has no  wheezing on a forced expiration.  HEART:  Regular rate and rhythm without murmurs.  ABDOMEN:  Soft, nontender, nondistended with positive bowel sounds.  EXTREMITIES:  Significant for some mild digital cyanosis and some  possible early clubbing.  His angles are preserved, however.  NEUROLOGICALLY:  He has a nonfocal exam.   DATA:  Pericardial fluid was collected on October 06, 2006.  This showed  lymphocytes and mesothelial cells without any evidence of malignancy.  CT scan of the chest from September 25, 2006 showed the loculated anterior  pericardial effusion and a 6 mm left upper lobe nodule and a 2 cm  subcarinal lymph node.   IMPRESSION:  1. History of tobacco use, exertional dyspnea and almost certain      chronic obstructive pulmonary disease.  2. Pulmonary nodule and subcarinal node in a patient with a history of      lung cancer.   PLAN:  1. Full pulmonary function testing to quantify  his airflow      limitation.  Once these data are available then  we will probably      start standing bronchodilators.  2. He needs a CT scan of the chest to look for interval change in his      nodule and/or his subcarinal node.  If his node remains enlarged      then he may merit biopsy either by bronchoscopy and Wang needle or      by mediastinoscopy.  I will follow up with [name] after his      pulmonary function tests have been performed to plan his      bronchodilator regimen.     Leslye Peer, MD  Electronically Signed    RSB/MedQ  DD: 10/17/2006  DT: 10/17/2006  Job #: [id number]   cc:   Rosalyn Gess. Norins, MD  Jesse Sans. Wall, MD, [facility]

## 2011-02-12 ENCOUNTER — Other Ambulatory Visit: Payer: Self-pay | Admitting: Internal Medicine

## 2011-03-29 LAB — CARDIAC PANEL(CRET KIN+CKTOT+MB+TROPI)
CK, MB: 0.6
Relative Index: INVALID
Total CK: 35
Troponin I: <0.01
Troponin I: <0.01

## 2011-03-29 LAB — CULTURE, BLOOD (ROUTINE X 2)

## 2011-03-29 LAB — CBC
HCT: 34.9 — ABNORMAL LOW
MCHC: 34.8
MCV: 98.6
Platelets: 94 — ABNORMAL LOW
RDW: 14.4
WBC: 4.9

## 2011-03-29 LAB — COMPREHENSIVE METABOLIC PANEL
Albumin: 3.4 — ABNORMAL LOW
BUN: 23
Chloride: 103
Creatinine, Ser: 1
GFR calc non Af Amer: >60
Total Bilirubin: 1.3 — ABNORMAL HIGH

## 2011-03-29 LAB — DIFFERENTIAL
Basophils Absolute: 0
Lymphocytes Relative: 14
Monocytes Absolute: 0.3
Neutro Abs: 3.7

## 2011-03-29 LAB — HEPATIC FUNCTION PANEL
Alkaline Phosphatase: 98
Bilirubin, Direct: 0.4 — ABNORMAL HIGH
Indirect Bilirubin: 1 — ABNORMAL HIGH
Total Bilirubin: 1.4 — ABNORMAL HIGH

## 2011-03-29 LAB — SEDIMENTATION RATE: Sed Rate: 103 — ABNORMAL HIGH

## 2011-04-16 LAB — CULTURE, BLOOD (ROUTINE X 2): Culture: NO GROWTH

## 2011-04-16 LAB — COMPREHENSIVE METABOLIC PANEL
AST: 33
Albumin: 4.1
BUN: 18
CO2: 22
Calcium: 9.2
Creatinine, Ser: 1.15
GFR calc Af Amer: >60
GFR calc non Af Amer: >60

## 2011-04-16 LAB — URINALYSIS, ROUTINE W REFLEX MICROSCOPIC
Bilirubin Urine: NEGATIVE
Glucose, UA: NEGATIVE
Hgb urine dipstick: NEGATIVE
Protein, ur: NEGATIVE
Urobilinogen, UA: 0.2

## 2011-04-16 LAB — URINE CULTURE

## 2011-04-16 LAB — BLOOD GAS, ARTERIAL
Acid-base deficit: 2.9 — ABNORMAL HIGH
Drawn by: 295541
O2 Content: 3
O2 Saturation: 97.7

## 2011-04-16 LAB — CK TOTAL AND CKMB (NOT AT ARMC)
CK, MB: 1.5
Total CK: 63

## 2011-04-16 LAB — CARDIAC PANEL(CRET KIN+CKTOT+MB+TROPI): Total CK: 173

## 2011-04-16 LAB — DIFFERENTIAL
Eosinophils Relative: 2
Lymphocytes Relative: 12
Lymphs Abs: 0.5 — ABNORMAL LOW
Neutro Abs: 3.4

## 2011-04-16 LAB — INFLUENZA A+B VIRUS AG-DIRECT(RAPID)
Inflenza A Ag: NEGATIVE
Influenza B Ag: NEGATIVE

## 2011-04-16 LAB — CBC
MCHC: 35.8
MCV: 97.9
Platelets: 90 — ABNORMAL LOW

## 2011-04-16 LAB — POCT CARDIAC MARKERS: Troponin i, poc: <0.05

## 2011-08-07 ENCOUNTER — Telehealth: Payer: Self-pay | Admitting: Internal Medicine

## 2011-08-07 NOTE — Telephone Encounter (Signed)
Called - left msg that will call again later. 1308 hrs

## 2011-08-07 NOTE — Telephone Encounter (Signed)
Patient's daughter is requesting a phone call from the md prior to making an appt due to patient's strange behavior. Please assist.
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
## 2011-08-14 ENCOUNTER — Encounter: Payer: Self-pay | Admitting: Internal Medicine

## 2011-08-14 ENCOUNTER — Ambulatory Visit (INDEPENDENT_AMBULATORY_CARE_PROVIDER_SITE_OTHER): Payer: Medicare Other | Admitting: Internal Medicine

## 2011-08-14 ENCOUNTER — Other Ambulatory Visit (INDEPENDENT_AMBULATORY_CARE_PROVIDER_SITE_OTHER): Payer: Medicare Other

## 2011-08-14 DIAGNOSIS — E7800 Pure hypercholesterolemia, unspecified: Secondary | ICD-10-CM

## 2011-08-14 DIAGNOSIS — R413 Other amnesia: Secondary | ICD-10-CM

## 2011-08-14 DIAGNOSIS — I10 Essential (primary) hypertension: Secondary | ICD-10-CM

## 2011-08-14 LAB — LIPID PANEL
LDL Cholesterol: 52 mg/dL (ref 0–99)
Total CHOL/HDL Ratio: 3
Triglycerides: 69 mg/dL (ref 0.0–149.0)
VLDL: 13.8 mg/dL (ref 0.0–40.0)

## 2011-08-14 MED ORDER — SERTRALINE HCL 25 MG PO TABS: ORAL_TABLET | ORAL | Status: DC

## 2011-08-14 MED ORDER — DONEPEZIL HCL 5 MG PO TABS: ORAL_TABLET | ORAL | Status: DC

## 2011-08-14 NOTE — Assessment & Plan Note (Signed)
Due for follow up lab with recommendations to follow.  

## 2011-08-14 NOTE — Patient Instructions (Signed)
Memory loss and "irritability" please start Aricept 5 mg once a day for a month then call the office so we can call in 10 mg once a day - therapeutic dose.  "Taking the Edge off" - start sertraline 25 mg once a day  - morning or midday for 4 days then STOP the remeron and take the sertraline at bedtime.  Return office visit in 3 weeks.
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
## 2011-08-15 LAB — COMPREHENSIVE METABOLIC PANEL
ALT: 19 U/L (ref 0–53)
AST: 30 U/L (ref 0–37)
Albumin: 3.7 g/dL (ref 3.5–5.2)
CO2: 27 mEq/L (ref 19–32)
Calcium: 8.7 mg/dL (ref 8.4–10.5)
Chloride: 102 mEq/L (ref 96–112)
Creatinine, Ser: 0.9 mg/dL (ref 0.4–1.5)
GFR: 85.48 mL/min (ref >60.00)
Potassium: 4.4 mEq/L (ref 3.5–5.1)
Sodium: 134 mEq/L — ABNORMAL LOW (ref 135–145)
Total Protein: 8.1 g/dL (ref 6.0–8.3)

## 2011-08-15 LAB — HEPATIC FUNCTION PANEL
Albumin: 3.7 g/dL (ref 3.5–5.2)
Alkaline Phosphatase: 69 U/L (ref 39–117)
Total Protein: 8.1 g/dL (ref 6.0–8.3)

## 2011-08-19 ENCOUNTER — Telehealth: Payer: Self-pay | Admitting: Internal Medicine

## 2011-08-19 ENCOUNTER — Encounter: Payer: Self-pay | Admitting: Internal Medicine

## 2011-08-19 NOTE — Telephone Encounter (Signed)
Noted. Will discuss B12 replacement at next visit.

## 2011-08-19 NOTE — Telephone Encounter (Signed)
Called pt to schedule B-12 injection, but stated he would not do any type of injection and did not care to supplement his b-12.  I explained to the patient that he would be receiving a letter with his lab results and a note regarding his b-12.  He continued to refuse.  He did state he would keep his follow up apt on 2/27.

## 2011-08-21 ENCOUNTER — Ambulatory Visit: Payer: Medicare Other

## 2011-08-23 ENCOUNTER — Other Ambulatory Visit: Payer: Self-pay

## 2011-08-23 MED ORDER — PRAVASTATIN SODIUM 40 MG PO TABS: 40.0000 mg | ORAL_TABLET | Freq: Every day | ORAL | Status: DC

## 2011-09-04 ENCOUNTER — Encounter: Payer: Self-pay | Admitting: Internal Medicine

## 2011-09-04 ENCOUNTER — Ambulatory Visit (INDEPENDENT_AMBULATORY_CARE_PROVIDER_SITE_OTHER): Payer: Medicare Other | Admitting: Internal Medicine

## 2011-09-04 ENCOUNTER — Ambulatory Visit (INDEPENDENT_AMBULATORY_CARE_PROVIDER_SITE_OTHER)
Admission: RE | Admit: 2011-09-04 | Discharge: 2011-09-04 | Disposition: A | Discharge status: Home / Self Care | Payer: Medicare Other | Source: Ambulatory Visit | Attending: Internal Medicine | Admitting: Internal Medicine

## 2011-09-04 DIAGNOSIS — M25539 Pain in unspecified wrist: Secondary | ICD-10-CM

## 2011-09-04 DIAGNOSIS — R413 Other amnesia: Secondary | ICD-10-CM

## 2011-09-04 DIAGNOSIS — M79641 Pain in right hand: Secondary | ICD-10-CM

## 2011-09-04 DIAGNOSIS — M25531 Pain in right wrist: Secondary | ICD-10-CM

## 2011-09-04 DIAGNOSIS — E539 Vitamin B deficiency, unspecified: Secondary | ICD-10-CM

## 2011-09-04 DIAGNOSIS — M79609 Pain in unspecified limb: Secondary | ICD-10-CM

## 2011-09-04 MED ORDER — INDOMETHACIN 25 MG PO CAPS: ORAL_CAPSULE | ORAL | Status: DC

## 2011-09-04 MED ORDER — SERTRALINE HCL 50 MG PO TABS: 50.0000 mg | ORAL_TABLET | Freq: Every day | ORAL | Status: DC

## 2011-09-04 MED ORDER — DONEPEZIL HCL 10 MG PO TABS: 10.0000 mg | ORAL_TABLET | Freq: Every evening | ORAL | Status: DC | PRN

## 2011-09-04 MED ORDER — CYANOCOBALAMIN 1000 MCG/ML IJ SOLN
1000.0000 ug | Freq: Once | INTRAMUSCULAR | Status: AC
  Administered 2011-09-04: 1000 ug via INTRAMUSCULAR

## 2011-09-04 MED ORDER — COLCHICINE 0.6 MG PO TABS: ORAL_TABLET | ORAL | Status: DC

## 2011-09-04 NOTE — Assessment & Plan Note (Signed)
Tolerating Aricept 5 mg. Also tolerating zoloft with improved behavior  Plan - advance aricept to 10 mg daily           Advance zoloft to 50 mg daily

## 2011-09-04 NOTE — Patient Instructions (Signed)
Severe wrist pain - may be an atypical presentation of gout (usually there will be heat and redness) vs injury vs acute non-gout arthritic flare.  Plan - x-rays of the right hand and wrist           Colchicine 0.6 mg - take two tablets, if no relief may take a third tablet 2 hours later. May repeat the next day if needed           Indomethacin 25 mg on e every 6 hours for pain relief. This is a NSAID drug - be careful about gastric irritation. Take with meals.

## 2011-09-04 NOTE — Assessment & Plan Note (Signed)
Acute pain right wrist - atypical gout vs injury.  Plan - x-ray wrist and hand           Colchicine 1.2 mg x 1 may repeat 0.6 mg at 2 hrs. May repeat 2/28 if needed           Indomethacin 25 mg every 6 hours as needed: GI precautions provided.

## 2011-09-04 NOTE — Progress Notes (Signed)
  Subjective:    Patient ID: [name], male    DOB: [birth year], 78 y.o.   MRN: [id number]  HPI [name] presents for an acutely painful right wrist. The pain developed in the last 24 hrs. He denies any fall or injury. He denies any strain or unusual activity. He has a h/o gout.  He has been on aricept 5 mg for about a month along with zoloft 25 mg. He has tolerated the medications well and per his daughter, present today, his mood and behavior is much better.  I have reviewed the patient's medical history in detail and updated the computerized patient record.    Review of Systems System review is negative for any constitutional, cardiac, pulmonary, GI or neuro symptoms or complaints other than as described in the HPI.     Objective:   Physical Exam Filed Vitals:   09/04/11 1057  BP: 148/68  Pulse: 77  Temp: 97 F (36.1 C)  Resp: 14   Pulm - normal respirations Cor - RRR Ext - right wrist with dorsal swelling, exquisite tenderness but no deformity. MCP-PIP joints tender without synovial thickening.       Assessment & Plan:

## 2011-09-05 ENCOUNTER — Telehealth: Payer: Self-pay | Admitting: Internal Medicine

## 2011-09-05 NOTE — Telephone Encounter (Signed)
Message copied by Newell Coral on Thu Sep 05, 2011  9:10 AM ------      Message from: Illene Regulus E      Created: Thu Sep 05, 2011  8:36 AM       Please call patient: advanced arthritic changes in the wrist. No fracture.            Did the colchicine and indomethacin help the pain?

## 2011-11-06 ENCOUNTER — Ambulatory Visit (INDEPENDENT_AMBULATORY_CARE_PROVIDER_SITE_OTHER): Payer: Medicare Other | Admitting: Internal Medicine

## 2011-11-06 ENCOUNTER — Encounter: Payer: Self-pay | Admitting: Internal Medicine

## 2011-11-06 VITALS — BP 140/67 | HR 60 | Temp 98.2°F | Resp 16 | Wt 159.0 lb

## 2011-11-06 DIAGNOSIS — I10 Essential (primary) hypertension: Secondary | ICD-10-CM

## 2011-11-06 DIAGNOSIS — M25531 Pain in right wrist: Secondary | ICD-10-CM

## 2011-11-06 DIAGNOSIS — D649 Anemia, unspecified: Secondary | ICD-10-CM

## 2011-11-06 DIAGNOSIS — E7800 Pure hypercholesterolemia, unspecified: Secondary | ICD-10-CM

## 2011-11-06 DIAGNOSIS — R1310 Dysphagia, unspecified: Secondary | ICD-10-CM

## 2011-11-06 DIAGNOSIS — R413 Other amnesia: Secondary | ICD-10-CM

## 2011-11-06 DIAGNOSIS — M25539 Pain in unspecified wrist: Secondary | ICD-10-CM

## 2011-11-06 DIAGNOSIS — I2510 Atherosclerotic heart disease of native coronary artery without angina pectoris: Secondary | ICD-10-CM

## 2011-11-06 MED ORDER — ESOMEPRAZOLE MAGNESIUM 40 MG PO CPDR: 40.0000 mg | DELAYED_RELEASE_CAPSULE | Freq: Every day | ORAL | Status: DC

## 2011-11-06 MED ORDER — COLCHICINE 0.6 MG PO TABS: ORAL_TABLET | ORAL | Status: DC

## 2011-11-06 NOTE — Patient Instructions (Signed)
Alcohol use - 4 oz of wine is 1 oz of alcohol. Alcohol is bad for your gout, bad for your memory, will interfere with sleep, will make acid reflux worse. You should limit yourself to 2 glasses of wine per day. You will actually feel and do better.  Regurgitation - it sounds like you have a swallowing problem. Most likely you have a stricture of the esophagus from acid reflux. Plan- Referral to GI for evaluation and probably upper endoscopy and if there is a stricture they can do a dilation.  If there is no stricture you will need a swallowing study  REDUCE alcohol intake - maximum 2 glasses of wine per day.  Nexium 40 mg every morning to reduce the acid reflux  Do not take indomethacin except for a flare of gout that is not relieved by colchicine  For aches, joint pain that is minor take Tylenol 1000 mg up to three times a day.  Gout - a hot, red, exquisitely painful joint.  Plan When you have an attack of gout take 2 colchicine and may take another at 1 hr if not relieved. Only take for gout flare  Reduce alcohol consumption to a max of 2 glasses of wine a day  Do not take indomethacin except as a last resort for unrelieved gout pain.  Sleep - alcohol is a stimulant and will interfere with sleep. Plan - reduce alcohol consumption  Memory and Behavior - please take the aricept every day and the zoloft every day. Alcohol will make memory and behavior problems worse.  Diet - Please eat a small breakfast every day and then your main meal in the evening.   Gout Gout is an inflammatory condition (arthritis) caused by a buildup of uric acid crystals in the joints. Uric acid is a chemical that is normally present in the blood. Under some circumstances, uric acid can form into crystals in your joints. This causes joint redness, soreness, and swelling (inflammation). Repeat attacks are common. Over time, uric acid crystals can form into masses (tophi) near a joint, causing disfigurement. Gout is  treatable and often preventable. CAUSES   The disease begins with elevated levels of uric acid in the blood. Uric acid is produced by your body when it breaks down a naturally found substance called purines. This also happens when you eat certain foods such as meats and fish. Causes of an elevated uric acid level include:  Being passed down from parent to child (heredity).   Diseases that cause increased uric acid production (obesity, psoriasis, some cancers).   Excessive alcohol use.   Diet, especially diets rich in meat and seafood.   Medicines, including certain cancer-fighting drugs (chemotherapy), diuretics, and aspirin.   Chronic kidney disease. The kidneys are no longer able to remove uric acid well.   Problems with metabolism.  Conditions strongly associated with gout include:  Obesity.   High blood pressure.   High cholesterol.   Diabetes.  Not everyone with elevated uric acid levels gets gout. It is not understood why some people get gout and others do not. Surgery, joint injury, and eating too much of certain foods are some of the factors that can lead to gout. SYMPTOMS    An attack of gout comes on quickly. It causes intense pain with redness, swelling, and warmth in a joint.   Fever can occur.   Often, only one joint is involved. Certain joints are more commonly involved:   Base of the big toe.  Knee.   Ankle.   Wrist.   Finger.  Without treatment, an attack usually goes away in a few days to weeks. Between attacks, you usually will not have symptoms, which is different from many other forms of arthritis. DIAGNOSIS   Your caregiver will suspect gout based on your symptoms and exam. Removal of fluid from the joint (arthrocentesis) is done to check for uric acid crystals. Your caregiver will give you a medicine that numbs the area (local anesthetic) and use a needle to remove joint fluid for exam. Gout is confirmed when uric acid crystals are seen in joint  fluid, using a special microscope. Sometimes, blood, urine, and X-ray tests are also used. TREATMENT   There are 2 phases to gout treatment: treating the sudden onset (acute) attack and preventing attacks (prophylaxis). Treatment of an Acute Attack  Medicines are used. These include anti-inflammatory medicines or steroid medicines.   An injection of steroid medicine into the affected joint is sometimes necessary.   The painful joint is rested. Movement can worsen the arthritis.   You may use warm or cold treatments on painful joints, depending which works best for you.   Discuss the use of coffee, vitamin C, or cherries with your caregiver. These may be helpful treatment options.  Treatment to Prevent Attacks After the acute attack subsides, your caregiver may advise prophylactic medicine. These medicines either help your kidneys eliminate uric acid from your body or decrease your uric acid production. You may need to stay on these medicines for a very long time. The early phase of treatment with prophylactic medicine can be associated with an increase in acute gout attacks. For this reason, during the first few months of treatment, your caregiver may also advise you to take medicines usually used for acute gout treatment. Be sure you understand your caregiver's directions. You should also discuss dietary treatment with your caregiver. Certain foods such as meats and fish can increase uric acid levels. Other foods such as dairy can decrease levels. Your caregiver can give you a list of foods to avoid. HOME CARE INSTRUCTIONS    Do not take aspirin to relieve pain. This raises uric acid levels.   Only take over-the-counter or prescription medicines for pain, discomfort, or fever as directed by your caregiver.   Rest the joint as much as possible. When in bed, keep sheets and blankets off painful areas.   Keep the affected joint raised (elevated).   Use crutches if the painful joint is in  your leg.   Drink enough water and fluids to keep your urine clear or pale yellow. This helps your body get rid of uric acid. Do not drink alcoholic beverages. They slow the passage of uric acid.   Follow your caregiver's dietary instructions. Pay careful attention to the amount of protein you eat. Your daily diet should emphasize fruits, vegetables, whole grains, and fat-free or low-fat milk products.   Maintain a healthy body weight.  SEEK MEDICAL CARE IF:    You have an oral temperature above 102 F (38.9 C).   You develop diarrhea, vomiting, or any side effects from medicines.   You do not feel better in 24 hours, or you are getting worse.  SEEK IMMEDIATE MEDICAL CARE IF:    Your joint becomes suddenly more tender and you have:   Chills.   An oral temperature above 102 F (38.9 C), not controlled by medicine.  MAKE SURE YOU:    Understand these instructions.   Will  watch your condition.   Will get help right away if you are not doing well or get worse.  Document Released: 06/21/2000 Document Revised: 06/13/2011 Document Reviewed: 10/02/2009 [facility] Patient Information 2012 [place], [place].   Esophageal Stricture The esophagus is the long, narrow tube which carries food and liquid from the mouth to the stomach. Sometimes a part of the esophagus becomes narrow and makes it difficult, painful, or even impossible to swallow. This is called an esophageal stricture.   CAUSES   Common causes of blockage or strictures of the esophagus are:  Exposure of the lower esophagus to the acid from the stomach may cause narrowing.   Hiatal hernia in which a small part of the stomach bulges up through the diaphragm can cause a narrowing in the bottom of the esophagus.   Scleroderma is a tissue disorder that affects the esophagus and makes swallowing difficult.   Achalasia is an absence of nerves in the lower esophagus and to the esophageal sphincter. This absence of nerves may be  congenital (present since birth). This can cause irregular spasms which do not allow food and fluid through.   Strictures may develop from swallowing materials which damage the esophagus. Examples are acids or alkalis such as lye.   Schatzki's Ring is a narrow ring of non-cancerous tissue which narrows the lower esophagus. The cause of this is unknown.   Growths can block the esophagus.  SYMPTOMS   Some of the problems are difficulty swallowing or pain with swallowing. DIAGNOSIS   Your caregiver often suspects this problem by taking a medical history. They will also do a physical exam. They may then take X-rays and/or perform an endoscopy. Endoscopy is an exam in which a tube like a small flexible telescope is used to look at your esophagus.   TREATMENT  One form of treatment is to dilate the narrow area. This means to stretch it.   When this is not successful, chest surgery may be required. This is a much more extensive form of treatment with a longer recovery time.  Both of the above treatments make the passage of food and water into the stomach easier. They also make it easier for stomach contents to bubble back into the esophagus. Special medications may be used following the procedure to help prevent further narrowing. Medications may be used to lower the amount of acid in the stomach juice.   SEEK IMMEDIATE MEDICAL CARE IF:    Your swallowing is becoming more painful, difficult, or you are unable to swallow.   You vomit up blood.   You develop black tarry stools.   You develop chills.   You have a fever.   You develop chest or abdominal pain.   You develop shortness of breath, feel lightheaded, or faint.  Follow up with medical care as your caregiver suggests. Document Released: 03/04/2006 Document Revised: 06/13/2011 Document Reviewed: 04/10/2006 [facility] Patient Information 2012 [place], [place].

## 2011-11-07 DIAGNOSIS — D649 Anemia, unspecified: Secondary | ICD-10-CM

## 2011-11-07 DIAGNOSIS — R1310 Dysphagia, unspecified: Secondary | ICD-10-CM | POA: Insufficient documentation

## 2011-11-07 MED ORDER — CYANOCOBALAMIN 1000 MCG/ML IJ SOLN: 1000.0000 ug | Freq: Once | INTRAMUSCULAR | Status: DC

## 2011-11-07 MED ORDER — CYANOCOBALAMIN 1000 MCG/ML IJ SOLN
1000.0000 ug | Freq: Once | INTRAMUSCULAR | Status: AC
  Administered 2011-11-07: 1000 ug via INTRAMUSCULAR

## 2011-11-07 NOTE — Assessment & Plan Note (Signed)
Lab Results  Component Value Date   CHOL 109 08/14/2011   HDL 43.20 08/14/2011   LDLCALC 52 08/14/2011   LDLDIRECT 59.0 11/08/2008   TRIG 69.0 08/14/2011   CHOLHDL 3 08/14/2011   Excellent control. Plan - continue pravastatin

## 2011-11-07 NOTE — Progress Notes (Signed)
Addended by: Elnora Morrison on: 11/07/2011 08:07 AM   Modules accepted: Orders

## 2011-11-07 NOTE — Assessment & Plan Note (Signed)
Patient with several episodes of regurgitation w/o nausea during meals. HIs daughter reports that he eats quickly, large bites which he doesn't masticate well and then will choke and regurgitate. It is hard to ascertain if this is a mechanical swallowing problem vs possible stricture.  Plan - Reviewed mechanism of GERD and esophageal stricture  Start Nexium 40 mg qAM  Advised to eat slowly, small bites,   Reduce alcohol consumption  GI referral for evaluation to r/o esophageal stricture.

## 2011-11-07 NOTE — Assessment & Plan Note (Signed)
His behavior continues to be erratic at time and obstinate in regard to his use of alcohol, dietary habits and personal affairs. His behavior has brought his daughter to tears at today's visit due to his reluctance to follow all medical recommendations.  Plan - continue aricept            Reduce alcohol intake.

## 2011-11-07 NOTE — Assessment & Plan Note (Signed)
He is symptom free. Risk factors are stable. He needs to consider cardiology follow-up.

## 2011-11-07 NOTE — Assessment & Plan Note (Signed)
BP Readings from Last 3 Encounters:  11/06/11 140/67  09/04/11 148/68  08/14/11 160/70   Mild elevation in BP. Not on medication.  Plan Decrease alcohol intake  Consider either beta-blocker or RAS drug at next visit.

## 2011-11-07 NOTE — Assessment & Plan Note (Signed)
He continues to have wrist pain and is taking indomethacin daily.  Plan - stop indomethacin except for clear cut attack of gout not relieved by colchicine           For gout flare- describe signs and symptoms - take colchicine 1.2 mg           For arthritic pain first line drug - APAP

## 2011-11-07 NOTE — Progress Notes (Signed)
  Subjective:    Patient ID: [name], male    DOB: April 11, 1931, 78 y.o.   MRN: [id number]  HPI [name] presents for follow up and is accompanied by his daughter.He is followed for dementia, gout, HTN, CAD, insomnia. His daughter reports that he has not always taken his medications as instructed, that he is drinking 6-8 glasses of wine a day, that he continues to have occasional disruptive behavior and unusual behavior, e.g. Keeping the title for his care in the glovebox, he eats quickly but will then have episodes of regurgitation. She is very concerned about him. She does assume fiduciary responsibility for his affairs. He does admit to drinking 6 glasses of wine per day. He does admit to have episodes where he will choke on his food and have trouble swallowing leading to regurgitation. He is pleasant and calm during our interview but in private, while I step out, he tells his daughter that he doesn't plan on following advice.   PMH, FamHx and SocHx reviewed for any changes and relevance.     Review of Systems System review is negative for any constitutional, cardiac, pulmonary, GI or neuro symptoms or complaints other than as described in the HPI.     Objective:   Physical Exam Filed Vitals:   11/06/11 1140  BP: 140/67  Pulse: 60  Temp: 98.2 F (36.8 C)  Resp: 16   Wt Readings from Last 3 Encounters:  11/06/11 159 lb (72.122 kg)  09/04/11 166 lb 8 oz (75.524 kg)  08/14/11 168 lb 4 oz (76.318 kg)   General - slender white man in no distress HEENT- [place]/AT, C&S clear  Cor - RRR Pulm - normal respirations Neuro - seems alert, to comprehend what is said. CN II-XII normal, normal gait.       Assessment & Plan:

## 2011-11-07 NOTE — Progress Notes (Signed)
Addended by: Elnora Morrison on: 11/07/2011 08:30 AM   Modules accepted: Orders

## 2011-11-11 ENCOUNTER — Encounter: Payer: Self-pay | Admitting: Gastroenterology

## 2011-11-12 ENCOUNTER — Encounter: Payer: Self-pay | Admitting: Internal Medicine

## 2011-12-04 ENCOUNTER — Encounter: Payer: Self-pay | Admitting: Internal Medicine

## 2011-12-06 ENCOUNTER — Ambulatory Visit: Payer: Medicare Other | Admitting: Internal Medicine

## 2012-01-28 ENCOUNTER — Other Ambulatory Visit: Payer: Self-pay

## 2012-01-28 MED ORDER — ESOMEPRAZOLE MAGNESIUM 40 MG PO CPDR: 40.0000 mg | DELAYED_RELEASE_CAPSULE | Freq: Every day | ORAL | Status: DC

## 2012-01-28 MED ORDER — PRAVASTATIN SODIUM 40 MG PO TABS: 40.0000 mg | ORAL_TABLET | Freq: Every day | ORAL | Status: DC

## 2012-01-28 MED ORDER — SERTRALINE HCL 50 MG PO TABS: 50.0000 mg | ORAL_TABLET | Freq: Every day | ORAL | Status: DC

## 2012-01-28 MED ORDER — DONEPEZIL HCL 10 MG PO TABS: 10.0000 mg | ORAL_TABLET | Freq: Every evening | ORAL | Status: DC | PRN

## 2012-03-18 ENCOUNTER — Telehealth: Payer: Self-pay

## 2012-03-18 NOTE — Telephone Encounter (Signed)
Pt's daughter called requesting a jury duty excuse letter from MD, please advise.

## 2012-03-18 NOTE — Telephone Encounter (Signed)
At 81 [name] is excused from jury duty and doesn't need a Physicist, medical. I don't think Trula Ore [place] is my patient so I won't need to write a letter for her.   To write a letter, if needed, I will need the juror number and dates

## 2012-03-18 NOTE — Telephone Encounter (Signed)
LEFT MESSAGE ON ANSWER MACHINE AT DAUGHTER #  OF RESPONSE FROM DR. NORINS THAT PATIENT IS EXCUSED FROM JURY DUTY AND WOULD NOT NEED A LETTER AT HIS AGE...03/18/2012

## 2012-04-30 ENCOUNTER — Encounter: Payer: Self-pay | Admitting: Internal Medicine

## 2012-04-30 ENCOUNTER — Ambulatory Visit (INDEPENDENT_AMBULATORY_CARE_PROVIDER_SITE_OTHER): Payer: Medicare Other | Admitting: Internal Medicine

## 2012-04-30 VITALS — BP 132/58 | HR 66 | Temp 96.8°F | Resp 16 | Wt 160.0 lb

## 2012-04-30 DIAGNOSIS — K219 Gastro-esophageal reflux disease without esophagitis: Secondary | ICD-10-CM

## 2012-04-30 DIAGNOSIS — R413 Other amnesia: Secondary | ICD-10-CM

## 2012-04-30 DIAGNOSIS — D649 Anemia, unspecified: Secondary | ICD-10-CM | POA: Insufficient documentation

## 2012-04-30 DIAGNOSIS — I10 Essential (primary) hypertension: Secondary | ICD-10-CM

## 2012-04-30 MED ORDER — CYANOCOBALAMIN 1000 MCG/ML IJ SOLN: 1000.0000 ug | Freq: Once | INTRAMUSCULAR | Status: DC

## 2012-04-30 MED ORDER — ESOMEPRAZOLE MAGNESIUM 40 MG PO CPDR: 40.0000 mg | DELAYED_RELEASE_CAPSULE | Freq: Every day | ORAL | Status: DC | PRN

## 2012-04-30 MED ORDER — CYANOCOBALAMIN 1000 MCG/ML IJ SOLN
1000.0000 ug | Freq: Once | INTRAMUSCULAR | Status: AC
  Administered 2012-04-30: 1000 ug via INTRAMUSCULAR

## 2012-04-30 NOTE — Patient Instructions (Addendum)
Thanks for coming to see me.  Reflux - it is ok to take the nexium as needed. Once your supply is gone you can use over the counter ranitidine 150 mg (Zantac) as an as needed drug for indigestion.  Memory loss - it is ok to stop aricept but be aware of the possibility of accelerated memory loss  Come back in 3 months for follow up.

## 2012-05-03 DIAGNOSIS — K219 Gastro-esophageal reflux disease without esophagitis: Secondary | ICD-10-CM | POA: Insufficient documentation

## 2012-05-03 NOTE — Assessment & Plan Note (Signed)
Per daughter symptoms of GERD are much improved.  Plan  May use Nexium prn  When supply of nexium consumed may use Ranitidine prn.

## 2012-05-03 NOTE — Progress Notes (Signed)
Subjective:    Patient ID: [name], male    DOB: [birth year], 78 y.o.   MRN: [id number]  HPI [name] presents for follow-up of his mental status. He has been doing much better on zoloft in regard to aggressive behavior. He has been on aricept but this has neither made much of a difference and has been giving him diarrhea. His daughter is with him today and is the primary historian. She does report that he has given up driving, he is eating better and does take his medications. She is wanting to stop aricept. Since he is drinking much less his stomach is much better and she would like to stop daily use of nexium.  Past Medical History  Diagnosis Date  . Coronary atherosclerosis   . Pure hypercholesterolemia   . Unspecified essential hypertension   . Shortness of breath   . Other diseases of lung, not elsewhere classified   . Influenza with other manifestations   . Other general symptoms   . Urinary tract infection, site not specified   . Chills (without fever)   . Squamous cell lung cancer    Past Surgical History  Procedure Date  . Coronary artery bypass graft 2007  . Thoracotomy 2006    right upper lobe  . Appendectomy 1945  . Cholecystectomy 1975  . Tonsillectomy 1942  . Hemorrhoid surgery 1975   Family History  Problem Relation Age of Onset  . Alzheimer's disease Father 11    deceased  . Other Mother 57  . Hyperlipidemia Sister   . Coronary artery disease Neg Hx   . Cancer Neg Hx   . Diabetes Neg Hx   . Heart disease Neg Hx    History   Social History  . Marital Status: Divorced    Spouse Name: N/A    Number of Children: 2  . Years of Education: 16+   Occupational History  . retired    Social History Main Topics  . Smoking status: Former Smoker    Quit date: 08/13/2005  . Smokeless tobacco: Never Used  . Alcohol Use: Yes     3-4 glasses of wine per day  . Drug Use: No  . Sexually Active: Not Currently   Other Topics Concern  . Not on file    Social History Narrative   The patient is a Comptroller by training.  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  The patient retired at age 47.The patient was married for 20 years to a former Miss [place],divorced.  He was married for 4 years, divorced, and is single.  He hasa stepson and he has a daughter, both live in [place].  The patientis a sports car aficionado, having had a [place] GT-250 in the past,currently driving a Porsche.  The patient has his own home.  He isindependent in his activities of daily living.    Current Outpatient Prescriptions on File Prior to Visit  Medication Sig Dispense Refill  . aspirin 81 MG tablet Take 81 mg by mouth daily.      . pravastatin (PRAVACHOL) 40 MG tablet Take 1 tablet (40 mg total) by mouth daily.  90 tablet  3  . sertraline (ZOLOFT) 50 MG tablet Take 1 tablet (50 mg total) by mouth daily.  90 tablet  3  . colchicine (COLCRYS) 0.6 MG tablet 2 tabs together, if persistent pain may take  1 tab at 2 hours. May repeat the next day if needed  6 tablet  5  . esomeprazole (NEXIUM) 40 MG capsule Take 1 capsule (40 mg total) by mouth daily as needed. Take 1 capsule every morning.  90 capsule  3  . indomethacin (INDOCIN) 25 MG capsule 1 po q 6 as needed for wrist pain  40 capsule  2  . loratadine (CLARITIN) 10 MG tablet Take 10 mg by mouth daily as needed. Allergies.          Review of Systems System review is negative for any constitutional, cardiac, pulmonary, GI or neuro symptoms or complaints other than as described in the HPI.     Objective:   Physical Exam Filed Vitals:   04/30/12 1000  BP: 132/58  Pulse: 66  Temp: 96.8 F (36 C)  Resp: 16   Gen'l- Elderly white man in no distress HEENT - C&S clear Cor - RRR PUlm - normal respirations Neuro - [place] and oriented to person and place. He  is conversant.       Assessment & Plan:

## 2012-05-03 NOTE — Assessment & Plan Note (Signed)
BP Readings from Last 3 Encounters:  04/30/12 132/58  11/06/11 140/67  09/04/11 148/68   Adequate control.

## 2012-05-03 NOTE — Assessment & Plan Note (Signed)
Behavior is much better on zoloft. Memory issues are unchanged but aricept has been causing diarrhea.  Plan - may stop aricept but patient and his daughter are informed of the potential acceleration of memory loss coming off of medication.

## 2012-06-01 ENCOUNTER — Ambulatory Visit (INDEPENDENT_AMBULATORY_CARE_PROVIDER_SITE_OTHER): Payer: Medicare Other

## 2012-06-01 DIAGNOSIS — D649 Anemia, unspecified: Secondary | ICD-10-CM

## 2012-06-01 MED ORDER — CYANOCOBALAMIN 1000 MCG/ML IJ SOLN
1000.0000 ug | Freq: Once | INTRAMUSCULAR | Status: AC
  Administered 2012-06-01: 1000 ug via INTRAMUSCULAR

## 2012-06-19 ENCOUNTER — Other Ambulatory Visit: Payer: Self-pay | Admitting: *Deleted

## 2012-06-19 DIAGNOSIS — D649 Anemia, unspecified: Secondary | ICD-10-CM

## 2012-06-19 MED ORDER — CYANOCOBALAMIN 1000 MCG/ML IJ SOLN: 1000.0000 ug | Freq: Once | INTRAMUSCULAR | Status: AC

## 2012-07-02 ENCOUNTER — Ambulatory Visit (INDEPENDENT_AMBULATORY_CARE_PROVIDER_SITE_OTHER): Payer: Medicare Other | Admitting: *Deleted

## 2012-07-02 DIAGNOSIS — D649 Anemia, unspecified: Secondary | ICD-10-CM

## 2012-07-02 MED ORDER — CYANOCOBALAMIN 1000 MCG/ML IJ SOLN
1000.0000 ug | Freq: Once | INTRAMUSCULAR | Status: AC
  Administered 2012-07-02: 1000 ug via INTRAMUSCULAR

## 2012-07-27 ENCOUNTER — Encounter: Payer: Self-pay | Admitting: Internal Medicine

## 2012-07-27 ENCOUNTER — Ambulatory Visit (INDEPENDENT_AMBULATORY_CARE_PROVIDER_SITE_OTHER): Payer: Medicare Other | Admitting: Internal Medicine

## 2012-07-27 VITALS — BP 122/58 | HR 72 | Temp 97.6°F | Resp 10 | Wt 156.1 lb

## 2012-07-27 DIAGNOSIS — D649 Anemia, unspecified: Secondary | ICD-10-CM

## 2012-07-27 DIAGNOSIS — Z23 Encounter for immunization: Secondary | ICD-10-CM

## 2012-07-27 DIAGNOSIS — E538 Deficiency of other specified B group vitamins: Secondary | ICD-10-CM

## 2012-07-27 DIAGNOSIS — R413 Other amnesia: Secondary | ICD-10-CM

## 2012-07-27 DIAGNOSIS — K219 Gastro-esophageal reflux disease without esophagitis: Secondary | ICD-10-CM

## 2012-07-27 DIAGNOSIS — I10 Essential (primary) hypertension: Secondary | ICD-10-CM

## 2012-07-27 DIAGNOSIS — E7800 Pure hypercholesterolemia, unspecified: Secondary | ICD-10-CM

## 2012-07-27 DIAGNOSIS — J984 Other disorders of lung: Secondary | ICD-10-CM

## 2012-07-27 MED ORDER — CYANOCOBALAMIN 1000 MCG/ML IJ SOLN
1000.0000 ug | Freq: Once | INTRAMUSCULAR | Status: AC
  Administered 2012-07-27: 1000 ug via INTRAMUSCULAR

## 2012-07-27 NOTE — Assessment & Plan Note (Signed)
Last LDL 52 on year ago. He has lost weight and has a better diet.  Plan -  Hold pravastatin  Repeat lab in 1 month, if LDL at or below goal will leave off medication.

## 2012-07-27 NOTE — Assessment & Plan Note (Signed)
Stable

## 2012-07-27 NOTE — Patient Instructions (Addendum)
Immunizations - will accept hospital immunization for pneumonia vaccine in 2007 or 2006. Will give tetanus today. Please check on coverage for Shingles vaccine.  Please check your records for last colonoscopy  Will stop pravastatin. Come back in for lipid check in 1 month. If normal range values will leave medication off.   I respect your decision about influenza.  Seems like you are doing well.

## 2012-07-27 NOTE — Assessment & Plan Note (Signed)
BP Readings from Last 3 Encounters:  07/27/12 122/58  04/30/12 132/58  11/06/11 140/67   Good control on present medication.

## 2012-07-27 NOTE — Assessment & Plan Note (Signed)
Lab Results  Component Value Date   VITAMINB12 200* 08/14/2011   Receiving B1`2 injections.

## 2012-07-27 NOTE — Progress Notes (Signed)
Subjective:    Patient ID: [name], male    DOB: 1931/04/01, 79 y.o.   MRN: [id number]  HPI [name] presents for medical follow up. He has been doing great. He is continuing on Sertraline with good  Control of of behavior. He has been eating regularly. He is not driving. He continues to drink 1 bottle of wine a day and tolerates this well. He has no new complaints. His daughter is with him and serves as primary historian. He/She are interested in a drug holiday from statins.  Past Medical History  Diagnosis Date  . Coronary atherosclerosis   . Pure hypercholesterolemia   . Unspecified essential hypertension   . Shortness of breath   . Other diseases of lung, not elsewhere classified   . Influenza with other manifestations   . Other general symptoms   . Urinary tract infection, site not specified   . Chills (without fever)   . Squamous cell lung cancer    Past Surgical History  Procedure Date  . Coronary artery bypass graft 2007  . Thoracotomy 2006    right upper lobe  . Appendectomy 1945  . Cholecystectomy 1975  . Tonsillectomy 1942  . Hemorrhoid surgery 1975   Family History  Problem Relation Age of Onset  . Alzheimer's disease Father 73    deceased  . Other Mother 80  . Hyperlipidemia Sister   . Coronary artery disease Neg Hx   . Cancer Neg Hx   . Diabetes Neg Hx   . Heart disease Neg Hx    History   Social History  . Marital Status: Divorced    Spouse Name: N/A    Number of Children: 2  . Years of Education: 16+   Occupational History  . retired    Social History Main Topics  . Smoking status: Former Smoker    Quit date: 08/13/2005  . Smokeless tobacco: Never Used  . Alcohol Use: Yes     Comment: 3-4 glasses of wine per day  . Drug Use: No  . Sexually Active: Not Currently   Other Topics Concern  . 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  The patient retired at age 20.The patient was married for 20 years to a former Miss [place],divorced.  He was married for 4 years, divorced, and is single.  He hasa stepson and he has a daughter, both live in [place].  The patientis a sports car aficionado, having had a [place] GT-250 in the past,currently driving a Porsche.  The patient has his own home.  He isindependent in his activities of daily living.    Current Outpatient Prescriptions on File Prior to Visit  Medication Sig Dispense Refill  . aspirin 81 MG tablet Take 81 mg by mouth daily.      . cyanocobalamin (,VITAMIN B-12,) 1000 MCG/ML injection Inject 1 mL (1,000 mcg total) into the muscle once.  10 mL  0  . pravastatin (PRAVACHOL) 40 MG tablet Take 1 tablet (40 mg total) by mouth daily.  90 tablet  3  . sertraline (ZOLOFT) 50 MG tablet Take 1 tablet (50 mg total) by mouth daily.  90 tablet  3  . colchicine (COLCRYS) 0.6 MG tablet 2 tabs together, if persistent pain may take 1 tab at  2 hours. May repeat the next day if needed  6 tablet  5  . indomethacin (INDOCIN) 25 MG capsule 1 po q 6 as needed for wrist pain  40 capsule  2  . loratadine (CLARITIN) 10 MG tablet Take 10 mg by mouth daily as needed. Allergies.          Review of Systems System review is negative for any constitutional, cardiac, pulmonary, GI or neuro symptoms or complaints other than as described in the HPI.     Objective:   Physical Exam Filed Vitals:   07/27/12 0935  BP: 122/58  Pulse: 72  Temp: 97.6 F (36.4 C)  Resp: 10   Wt Readings from Last 3 Encounters:  07/27/12 156 lb 1.9 oz (70.816 kg)  04/30/12 160 lb (72.576 kg)  11/06/11 159 lb (72.122 kg)   BMI 22.7  Gen'l - WNWD white man in no distress HEENT- C&S clear Cor- 2+ radial pulse, RRR Pulm - normal respirations, lungs CTAP Neuro - A&O to person and  place. Poor memory. Normal gait and station.       Assessment & Plan:

## 2012-07-27 NOTE — Assessment & Plan Note (Signed)
Stable. He has stopped PPI therapy with no adverse effects.

## 2012-07-27 NOTE — Assessment & Plan Note (Signed)
Patient doing well.  Plan -  Routine surveillance - CT chest w/o contrast Feb.

## 2012-08-10 ENCOUNTER — Telehealth: Payer: Self-pay | Admitting: Internal Medicine

## 2012-08-10 NOTE — Telephone Encounter (Signed)
Patients daughter is calling to get the exact name of the shingles vaccine that we will give her father, she needs to let his insurance know this information prior to the administration of the vaccine

## 2012-08-20 ENCOUNTER — Other Ambulatory Visit: Payer: Medicare Other

## 2012-08-21 ENCOUNTER — Other Ambulatory Visit: Payer: Medicare Other

## 2012-08-24 ENCOUNTER — Other Ambulatory Visit (INDEPENDENT_AMBULATORY_CARE_PROVIDER_SITE_OTHER): Payer: Medicare Other

## 2012-08-24 ENCOUNTER — Ambulatory Visit (INDEPENDENT_AMBULATORY_CARE_PROVIDER_SITE_OTHER): Payer: Medicare Other | Admitting: *Deleted

## 2012-08-24 DIAGNOSIS — E538 Deficiency of other specified B group vitamins: Secondary | ICD-10-CM

## 2012-08-24 DIAGNOSIS — E7800 Pure hypercholesterolemia, unspecified: Secondary | ICD-10-CM

## 2012-08-24 DIAGNOSIS — Z2911 Encounter for prophylactic immunotherapy for respiratory syncytial virus (RSV): Secondary | ICD-10-CM

## 2012-08-24 DIAGNOSIS — Z23 Encounter for immunization: Secondary | ICD-10-CM

## 2012-08-24 LAB — LIPID PANEL
Cholesterol: 113 mg/dL (ref 0–200)
HDL: 40.6 mg/dL (ref >39.00)
VLDL: 15.4 mg/dL (ref 0.0–40.0)

## 2012-08-24 MED ORDER — CYANOCOBALAMIN 1000 MCG/ML IJ SOLN
1000.0000 ug | Freq: Once | INTRAMUSCULAR | Status: AC
  Administered 2012-08-24: 1000 ug via INTRAMUSCULAR

## 2012-08-27 ENCOUNTER — Ambulatory Visit (INDEPENDENT_AMBULATORY_CARE_PROVIDER_SITE_OTHER)
Admission: RE | Admit: 2012-08-27 | Discharge: 2012-08-27 | Disposition: A | Discharge status: Home / Self Care | Payer: Medicare Other | Source: Ambulatory Visit | Attending: Internal Medicine | Admitting: Internal Medicine

## 2012-08-27 DIAGNOSIS — J984 Other disorders of lung: Secondary | ICD-10-CM

## 2012-08-30 ENCOUNTER — Other Ambulatory Visit: Payer: Self-pay | Admitting: Internal Medicine

## 2012-08-30 DIAGNOSIS — J84112 Idiopathic pulmonary fibrosis: Secondary | ICD-10-CM

## 2012-09-15 ENCOUNTER — Encounter: Payer: Self-pay | Admitting: Pulmonary Disease

## 2012-09-15 ENCOUNTER — Ambulatory Visit (INDEPENDENT_AMBULATORY_CARE_PROVIDER_SITE_OTHER): Payer: Medicare Other | Admitting: Pulmonary Disease

## 2012-09-15 VITALS — BP 134/58 | HR 63 | Temp 96.6°F | Ht 68.0 in | Wt 159.8 lb

## 2012-09-15 DIAGNOSIS — J8410 Pulmonary fibrosis, unspecified: Secondary | ICD-10-CM

## 2012-09-15 DIAGNOSIS — J438 Other emphysema: Secondary | ICD-10-CM

## 2012-09-15 DIAGNOSIS — J439 Emphysema, unspecified: Secondary | ICD-10-CM

## 2012-09-15 DIAGNOSIS — R911 Solitary pulmonary nodule: Secondary | ICD-10-CM

## 2012-09-15 DIAGNOSIS — J849 Interstitial pulmonary disease, unspecified: Secondary | ICD-10-CM | POA: Insufficient documentation

## 2012-09-15 NOTE — Assessment & Plan Note (Signed)
The patient has a 6 mm lung nodule in his right lower lobe, and given his history of lung cancer, this needs to be followed according to Fleischner guidelines.  I will schedule him for a followup CT in 6 months.  He also has subcarinal lymphadenopathy, however this has changed very little from 2010.

## 2012-09-15 NOTE — Patient Instructions (Addendum)
Will set up for a followup ct chest in 6mos to look at your lung spot and also the scarring in your lungs. Please call if you have increasing shortness of breath, and we can evaluate further to see if you may benefit from medication.

## 2012-09-15 NOTE — Assessment & Plan Note (Signed)
The patient has emphysematous changes on his CT chest, but this does not necessarily translate into obstructive lung disease.  He denies any issues with his activities of daily living, and feels that he is satisfied with his exertional tolerance.  I have discussed with him the possibility of doing pulmonary function studies, but he and his family member really want to avoid a lot of intervention.

## 2012-09-15 NOTE — Assessment & Plan Note (Addendum)
The patient's CT chest shows findings consistent with early interstitial lung disease.  The various radiographic findings are most consistent with usual interstitial pneumonitis, though his mild groundglass opacities can be associated with nonspecific interstitial pneumonitis.  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  I have discussed with him the workup for interstitial disease, including blood work for autoimmune disease and also PFTs.  Given the fact that he is not overly symptomatic at this time, we would probably just do surveillance with yearly chest CTs unless his autoimmune blood work was significantly positive.  He has expressed a desire to avoid an aggressive workup, and therefore will not pursue further.  If he begins to have increasing shortness of breath, he will need an autoimmune workup and also full PFTs.

## 2012-09-15 NOTE — Progress Notes (Signed)
  Subjective:    Patient ID: [name], male    DOB: 1931/03/20, 79 y.o.   MRN: [id number]  HPI The patient is a 79 year old male who been asked to see for an abnormal CT chest.  He recently had a CT scan of his chest that showed multiple findings, including a 6 mm right lower lobe nodule and also subcarinal lymphadenopathy with minimal change from 2010.  He also had subpleural reticulation that appeared to be mildly increase from his prior scan in 2010, as well as questionable early honeycombing and also traction bronchiectasis.  There were also subtle areas of groundglass opacities.  The patient has a history of squamous cell lung cancer in 2006, and is status post right upper lobectomy.  He has had no evidence for recurrence over the years.  The patient currently feels that his exertional tolerance is the same over the last one year, and tells me that he is satisfied with his quality of life with respect to his breathing.  He will get winded to some degree with one flight of stairs, but feels that he can walk distances easily without shortness of breath.  He denies any cough or mucus production.  He has never had significant asbestos exposure, and has never been on antiarrhythmics or chronic antibiotics.  He has worked in [employer] for many years, but was never exposed to [employer] or mother boards.  He denies any family history of autoimmune disease, and has no symptoms currently to suggest an autoimmune process.  He's been eating well and has no significant weight loss.  He has not had recent pulmonary function studies.   Review of Systems  Constitutional: Negative for fever and unexpected weight change.  HENT: Negative for ear pain, nosebleeds, congestion, sore throat, rhinorrhea, sneezing, trouble swallowing, dental problem, postnasal drip and sinus pressure.   Eyes: Negative for redness and itching.  Respiratory: Negative for cough, chest tightness, shortness of breath and  wheezing.   Cardiovascular: Negative for palpitations and leg swelling.  Gastrointestinal: Negative for nausea and vomiting.  Genitourinary: Negative for dysuria.  Musculoskeletal: Negative for joint swelling.  Skin: Negative for rash.  Neurological: Negative for headaches.  Hematological: Does not bruise/bleed easily.  Psychiatric/Behavioral: Negative for dysphoric mood. The patient is not nervous/anxious.        Objective:   Physical Exam Constitutional:  Well developed, no acute distress  HENT:  Nares patent without discharge  Oropharynx without exudate, palate and uvula are normal  Eyes:  Perrla, eomi, no scleral icterus  Neck:  No JVD, no TMG  Cardiovascular:  Normal rate, regular rhythm, no rubs or gallops.  No murmurs        Intact distal pulses  Pulmonary :  Normal breath sounds, no stridor or respiratory distress   No rhonchi, or wheezing, mild basilar crackles.  Abdominal:  Soft, nondistended, bowel sounds present.  No tenderness noted.   Musculoskeletal:  No significant lower extremity edema noted.  Lymph Nodes:  No cervical lymphadenopathy noted  Skin:  No cyanosis noted  Neurologic:  Alert, appropriate, moves all 4 extremities without obvious deficit.         Assessment & Plan:

## 2012-10-27 ENCOUNTER — Ambulatory Visit (INDEPENDENT_AMBULATORY_CARE_PROVIDER_SITE_OTHER): Payer: Medicare Other

## 2012-10-27 DIAGNOSIS — D649 Anemia, unspecified: Secondary | ICD-10-CM

## 2012-10-27 MED ORDER — CYANOCOBALAMIN 1000 MCG/ML IJ SOLN
1000.0000 ug | Freq: Once | INTRAMUSCULAR | Status: AC
  Administered 2012-10-27: 1000 ug via INTRAMUSCULAR

## 2012-12-29 ENCOUNTER — Ambulatory Visit (INDEPENDENT_AMBULATORY_CARE_PROVIDER_SITE_OTHER): Payer: Medicare Other

## 2012-12-29 DIAGNOSIS — D649 Anemia, unspecified: Secondary | ICD-10-CM

## 2012-12-29 MED ORDER — CYANOCOBALAMIN 1000 MCG/ML IJ SOLN
1000.0000 ug | Freq: Once | INTRAMUSCULAR | Status: AC
  Administered 2012-12-29: 1000 ug via INTRAMUSCULAR

## 2013-01-18 ENCOUNTER — Ambulatory Visit (INDEPENDENT_AMBULATORY_CARE_PROVIDER_SITE_OTHER): Payer: Medicare Other | Admitting: Internal Medicine

## 2013-01-18 ENCOUNTER — Encounter: Payer: Self-pay | Admitting: Internal Medicine

## 2013-01-18 VITALS — BP 120/50 | HR 68 | Temp 97.0°F | Wt 156.8 lb

## 2013-01-18 DIAGNOSIS — N498 Inflammatory disorders of other specified male genital organs: Secondary | ICD-10-CM

## 2013-01-18 DIAGNOSIS — N492 Inflammatory disorders of scrotum: Secondary | ICD-10-CM

## 2013-01-18 MED ORDER — DOXYCYCLINE HYCLATE 100 MG PO TABS: 100.0000 mg | ORAL_TABLET | Freq: Two times a day (BID) | ORAL | Status: DC

## 2013-01-18 MED ORDER — FLUCONAZOLE 100 MG PO TABS: 100.0000 mg | ORAL_TABLET | Freq: Every day | ORAL | Status: DC

## 2013-01-18 NOTE — Patient Instructions (Addendum)
Scrotal infection - yeast infection vs dermatitis. Very tender.   Plan - doxycycline 100 mg bid x 10 days  Diflucan 100 mg qd x 14 days  Tub baths and careful drying - no topical medications or GoldBond  Referral to urology for Tuesday or Wednesday  For fever, spread of redness to abdomen or getting sick call!!

## 2013-01-18 NOTE — Progress Notes (Signed)
Subjective:    Patient ID: [name], male    DOB: October 31, 1930, 79 y.o.   MRN: [id number]  HPI Patient presents for pain in groin/genitalia. He has also had some blood on the toilet.   Past Medical History  Diagnosis Date  . Coronary atherosclerosis   . Pure hypercholesterolemia   . Unspecified essential hypertension   . Shortness of breath   . Other diseases of lung, not elsewhere classified   . Influenza with other manifestations   . Other general symptoms(780.99)   . Urinary tract infection, site not specified   . Chills (without fever)   . Squamous cell lung cancer    Past Surgical History  Procedure Laterality Date  . Coronary artery bypass graft  2007  . Thoracotomy  2006    right upper lobe  . Appendectomy  1945  . Cholecystectomy  1975  . Tonsillectomy  1942  . Hemorrhoid surgery  1975   Family History  Problem Relation Age of Onset  . Alzheimer's disease Father 34    deceased  . Other Mother 2  . Hyperlipidemia Sister   . Coronary artery disease Neg Hx   . Cancer Neg Hx   . Diabetes Neg Hx   . Heart disease Neg Hx    History   Social History  . Marital Status: Divorced    Spouse Name: N/A    Number of Children: 2  . Years of Education: 16+   Occupational History  . retired    Social History Main Topics  . Smoking status: Former Smoker -- 2.00 packs/day for 53 years    Types: Cigarettes    Quit date: 06/07/2004  . Smokeless tobacco: Never Used  . Alcohol Use: Yes     Comment: 1-2 glasses of wine per day---pt used to drink 2-3 bottles daily of wine, decreased to 1 bottle daily, now down to 1-2 glasses daily  . Drug Use: No  . Sexually Active: Not Currently   Other Topics Concern  . 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  The patient retired at age 47.   The patient was married for 20 years to a former Miss [place],   divorced.  He was married for 4 years, divorced, and is single.  He has   a stepson and he has a daughter, both live in [place].  The patient   is a sports car aficionado, having had a [place] GT-250 in the past,   currently driving a [employer]orsche.  The patient has his own home.  He is   independent in his activities of daily living.    Current Outpatient Prescriptions on File Prior to Visit  Medication Sig Dispense Refill  . aspirin 81 MG tablet Take 81 mg by mouth daily.      . cyanocobalamin (,VITAMIN B-12,) 1000 MCG/ML injection Inject 1 mL (1,000 mcg total) into the muscle once.  10 mL  0  . sertraline (ZOLOFT) 50 MG tablet Take 1 tablet (50 mg total) by mouth daily.  90 tablet  3   No current facility-administered medications on file prior to visit.      Review of Systems System review is negative for any constitutional, cardiac, pulmonary, GI or  neuro symptoms or complaints other than as described in the HPI.      Objective:   Physical Exam Filed Vitals:   01/18/13 1654  BP: 120/50  Pulse: 68  Temp: 97 F (36.1 C)   Cor - RRR Pulm - short of breath with standing up Derm-GU - scrotum with erythema with multiple erosion. Wet and gold bond is clumped. No necrotic appearing tissue.        Assessment & Plan:  Scrotal infection - yeast infection vs dermatitis. Very tender.   Plan - doxycycline 100 mg bid x 10 days  Diflucan 100 mg qd x 14 days  Wash with warm water and careful drying - no topical medications or GoldBond  Referral to urology for Tuesday or Wednesday  For fever, spread of redness to abdomen or getting sick call!!

## 2013-01-19 ENCOUNTER — Other Ambulatory Visit: Payer: Self-pay | Admitting: Internal Medicine

## 2013-01-24 ENCOUNTER — Other Ambulatory Visit: Payer: Self-pay | Admitting: Internal Medicine

## 2013-02-19 ENCOUNTER — Encounter: Payer: Self-pay | Admitting: Internal Medicine

## 2013-02-25 ENCOUNTER — Ambulatory Visit (INDEPENDENT_AMBULATORY_CARE_PROVIDER_SITE_OTHER): Payer: Medicare Other | Admitting: Internal Medicine

## 2013-02-25 ENCOUNTER — Encounter: Payer: Self-pay | Admitting: Internal Medicine

## 2013-02-25 VITALS — BP 126/50 | HR 71 | Temp 96.9°F | Wt 154.8 lb

## 2013-02-25 DIAGNOSIS — R413 Other amnesia: Secondary | ICD-10-CM

## 2013-02-25 DIAGNOSIS — E538 Deficiency of other specified B group vitamins: Secondary | ICD-10-CM

## 2013-02-25 DIAGNOSIS — D649 Anemia, unspecified: Secondary | ICD-10-CM

## 2013-02-25 DIAGNOSIS — I2510 Atherosclerotic heart disease of native coronary artery without angina pectoris: Secondary | ICD-10-CM

## 2013-02-25 DIAGNOSIS — F329 Major depressive disorder, single episode, unspecified: Secondary | ICD-10-CM

## 2013-02-25 DIAGNOSIS — I10 Essential (primary) hypertension: Secondary | ICD-10-CM

## 2013-02-25 MED ORDER — CYANOCOBALAMIN 1000 MCG/ML IJ SOLN
1000.0000 ug | Freq: Once | INTRAMUSCULAR | Status: AC
  Administered 2013-02-25: 1000 ug via INTRAMUSCULAR

## 2013-02-25 NOTE — Assessment & Plan Note (Signed)
B12 IM injection (1000 units in 1 ml) was administered at this visit Plan: follow up in one month for the monthly shot.

## 2013-02-25 NOTE — Patient Instructions (Addendum)
Thanks for working with me Romeo Apple) today.  Sertraline: you can change from 50 mg daily to 25 mg daily.   Plan: The setraline pills are scored.  You can break them in half and take half a pill per day.  We would like you to see a cardiologist since you have had bypass surgery in the past.  You should talk to the cardiologist about your recent chest pain.  We will send in a referral. Plan: The cardiologist's office will call you to schedule an appointment.

## 2013-02-25 NOTE — Assessment & Plan Note (Signed)
[name] endorses chest pain upon ROS.  Based on his past medical history, the long interval between his last cardiology visit and today and the fact that he is an unreliable historian, we will refer him to cardiology.  Plan: referal sent to [facility].

## 2013-02-25 NOTE — Assessment & Plan Note (Signed)
[name] hypertension was at goal today, managed by lifestyle changed. BP: 126/50 mmHg  Plan: Advise patient to continue making good lifestyle (diet, exercise, smoking, etc.) choices.

## 2013-02-25 NOTE — Progress Notes (Signed)
Subjective:     Patient ID: [name], male   DOB: 1930/10/25, 79 y.o.   MRN: [id number]  HPI [name] is an 79 year old gentleman here with his daughter (who is the primary historian) to follow-up on a skin rash, for a B12 shot and desire to decrease daily dose of sertraline from 50 mg to 25 mg.  His past medical history is significant for quadrouple bypass surgery in 2007.  Upon review of systems, [name] endorses a "sharp" chest pain that occurred a few weeks ago under uncertain circumstances.  Upon questioning by his daughter, he estimates that it followed walking to lunch.  He stated that the pain lasted for a relatively short duration without any diaphoresis, no radiation, no or chest tightness.  He did not feel that he had a weight on his chest.  F/U on on rash - cleared up  Regarding the B12 IM injection, he has had no worrisome side effects.  His daughter recalls his skin itched last winter, but could not recall whether this coincided with his injection.  Regarding the sertraline, he had initially been taking 25 mg for problems controlling anxiety and anger, then increased the dose to 50 mg to preempt life stressors.  He has tolerated the medicine well.  His daughter doesn't think he needs 50 mg any more and would like to do a trial at a reduced dose.   Review of Systems General: no fatigue, diaphoresis, tremors or chills HEENT: no acute changes in vision or hearing changes CV: endorses chest pain, no palpitations  Pulm: no SOB, tightness  GI: no N/V/C/D, no blood in stool  Past Medical History  Diagnosis Date   Coronary atherosclerosis    Pure hypercholesterolemia    Unspecified essential hypertension    Shortness of breath    Other diseases of lung, not elsewhere classified    Influenza with other manifestations    Other general symptoms(780.99)    Urinary tract infection, site not specified    Chills (without fever)    Squamous cell lung cancer     Past Surgical History  Procedure Laterality Date   Coronary artery bypass graft  2007   Thoracotomy  2006    right upper lobe   Appendectomy  1945   Cholecystectomy  1975   Tonsillectomy  1942   Hemorrhoid surgery  1975   Family History  Problem Relation Age of Onset   Alzheimer's disease Father 93    deceased   Other Mother 66   Hyperlipidemia Sister    Coronary artery disease Neg Hx    Cancer Neg Hx    Diabetes Neg Hx    Heart disease Neg Hx    History   Social History   Marital Status: Divorced    Spouse Name: N/A    Number of Children: 2   Years of Education: 16+   Occupational History   retired    Social History Main Topics   Smoking status: Former Smoker -- 2.00 packs/day for 53 years    Types: Cigarettes    Quit date: 06/07/2004   Smokeless tobacco: Never Used   Alcohol Use: Yes     Comment: 1-2 glasses of wine per day---pt used to drink 2-3 bottles daily of wine, decreased to 1 bottle daily, now down to 1-2 glasses daily   Drug Use: No   Sexual Activity: Not Currently   Other Topics Concern   Not on file   Social History Narrative  The patient is a Comptroller by training.  He   was an English as a second language teacher for [employer], working on [employer], and he continued on this position when it got   acquired by [employer].  The patient retired at age 36.   The patient was married for 20 years to a former Miss [place],   divorced.  He was married for 4 years, divorced, and is single.  He has   a stepson and he has a daughter, both live in [place].  The patient   is a sports car aficionado, having had a [place] GT-250 in the past,   currently driving a Porsche.  The patient has his own home.  He is   independent in his activities of daily living.       Objective:   Physical Exam Genl: elderly man in NAD CV: RRR, nl s1/s2, no murmurs, rubs or gallops, distal pulses  bilaterally intact Pulm: lungs bilaterally clear to auscultation  Neuro: sensation bilaterally intact in extremities, cerebellar function largely intact    Assessment and Plan:

## 2013-02-27 NOTE — Assessment & Plan Note (Signed)
Dementia appears to be stable. He is not driving and his ADLs are assisted by his daughter.

## 2013-03-18 ENCOUNTER — Other Ambulatory Visit: Payer: Medicare Other

## 2013-04-20 ENCOUNTER — Telehealth: Payer: Self-pay | Admitting: *Deleted

## 2013-04-20 NOTE — Telephone Encounter (Signed)
Trula Ore, pts daughter, called states insurance company is requiring a 90day supply of Sertraline.  Further states she has been splitting Rx in have as ordered by MD.  Requests the rx written for 25mg  instead of 50mg .

## 2013-04-20 NOTE — Telephone Encounter (Signed)
Ok for sertraline 25 mg 1 po qd, # 90, 3 refills

## 2013-04-21 MED ORDER — SERTRALINE HCL 25 MG PO TABS: ORAL_TABLET | ORAL | Status: DC

## 2013-04-28 ENCOUNTER — Ambulatory Visit (INDEPENDENT_AMBULATORY_CARE_PROVIDER_SITE_OTHER): Payer: Medicare Other | Admitting: Internal Medicine

## 2013-04-28 ENCOUNTER — Encounter: Payer: Self-pay | Admitting: Internal Medicine

## 2013-04-28 VITALS — BP 144/60 | HR 65 | Wt 156.8 lb

## 2013-04-28 DIAGNOSIS — N492 Inflammatory disorders of scrotum: Secondary | ICD-10-CM

## 2013-04-28 DIAGNOSIS — B372 Candidiasis of skin and nail: Secondary | ICD-10-CM

## 2013-04-28 DIAGNOSIS — N498 Inflammatory disorders of other specified male genital organs: Secondary | ICD-10-CM

## 2013-04-28 MED ORDER — FLUCONAZOLE 100 MG PO TABS: 100.0000 mg | ORAL_TABLET | Freq: Every day | ORAL | Status: DC

## 2013-04-28 MED ORDER — DOXYCYCLINE HYCLATE 100 MG PO TABS
100.0000 mg | ORAL_TABLET | Freq: Two times a day (BID) | ORAL | Status: DC
Start: 2013-04-28 — End: 2013-12-13

## 2013-04-28 NOTE — Progress Notes (Signed)
  Subjective:    Patient ID: [name], male    DOB: 12/26/1930, 79 y.o.   MRN: [id number]  HPI [name] presents for recurrent problem of inflammation in the groin with pain and discomfort. No fever or chills. He admits scratching a lot. No major testicular pain.  PMH, FamHx and SocHx reviewed for any changes and relevance.  Current Outpatient Prescriptions on File Prior to Visit  Medication Sig Dispense Refill  . aspirin 81 MG tablet Take 81 mg by mouth daily.      . cyanocobalamin (,VITAMIN B-12,) 1000 MCG/ML injection Inject 1 mL (1,000 mcg total) into the muscle once.  10 mL  0  . sertraline (ZOLOFT) 25 MG tablet Take 1 tablet by mouth twice daily  90 tablet  3   No current facility-administered medications on file prior to visit.      Review of Systems System review is negative for any constitutional, cardiac, pulmonary, GI or neuro symptoms or complaints other than as described in the HPI.      Objective:   Physical Exam Filed Vitals:   04/28/13 1511  BP: 144/60  Pulse: 65   Gen'l - very thin white man in no distress. Derm - in the groin and supra-pubic region there is an erythematous macular rash with satellite lesion, excoriation and what appears to be secondary infection.         Assessment & Plan:

## 2013-04-28 NOTE — Patient Instructions (Signed)
Skin - fungal infection in the groin and extending up to the suprapubic region with scabbing from scratching and the development of a secondary bacterial infection.  Plan Doxycycline 100 mg twice a day for 10 days for the infection  Diflucan 100 mg daily x 14 days for the yeast/fungal infection  OTC rantidine 150 mg twice a day to help reduce the itching  Wash with soap and water then dry thoroughly twice a day   Return office visit next Monday or Tuesday for follow-up.

## 2013-05-02 DIAGNOSIS — B372 Candidiasis of skin and nail: Secondary | ICD-10-CM | POA: Insufficient documentation

## 2013-05-02 NOTE — Assessment & Plan Note (Signed)
Had previous yeast infection groin and penis that responded to doxy and diflucan. He was seen by urology and also treated for epididymitis. Now with recurrent yeast infection with secondary infection from excoriation extending to the suprapubic regioin.  Plan Doxycycline 100 mg bid  Diflucan 100 mg daily  Wash daily with soap and water and dry thoroughly  Return for follow up Monday 10/27 or Tuesday 10/28

## 2013-05-03 ENCOUNTER — Encounter: Payer: Self-pay | Admitting: Internal Medicine

## 2013-05-03 ENCOUNTER — Ambulatory Visit (INDEPENDENT_AMBULATORY_CARE_PROVIDER_SITE_OTHER): Payer: Medicare Other | Admitting: Internal Medicine

## 2013-05-03 VITALS — BP 140/60 | HR 65 | Wt 158.0 lb

## 2013-05-03 DIAGNOSIS — B372 Candidiasis of skin and nail: Secondary | ICD-10-CM

## 2013-05-03 NOTE — Progress Notes (Signed)
  Subjective:    Patient ID: [name], male    DOB: 08/13/1930, 79 y.o.   MRN: [id number]  HPI Presents for recheck of extensive yeast dermatitis groin and lower abdomen. He was startedon doxycyline and diflucan 10/22. He continues to have pruritus. He has not been washing the area daily. He does continue to scratch at the rash. No fevers, chills or feeling otherwise ill.  PMH, FamHx and SocHx reviewed for any changes and relevance.  Current Outpatient Prescriptions on File Prior to Visit  Medication Sig Dispense Refill  . aspirin 81 MG tablet Take 81 mg by mouth daily.      . cyanocobalamin (,VITAMIN B-12,) 1000 MCG/ML injection Inject 1 mL (1,000 mcg total) into the muscle once.  10 mL  0  . doxycycline (VIBRA-TABS) 100 MG tablet Take 1 tablet (100 mg total) by mouth 2 (two) times daily.  20 tablet  0  . fluconazole (DIFLUCAN) 100 MG tablet Take 1 tablet (100 mg total) by mouth daily.  14 tablet  1  . sertraline (ZOLOFT) 25 MG tablet Take 1 tablet by mouth twice daily  90 tablet  3   No current facility-administered medications on file prior to visit.      Review of Systems System review is negative for any constitutional, cardiac, pulmonary, GI or neuro symptoms or complaints other than as described in the HPI.     Objective:   Physical Exam Filed Vitals:   05/03/13 1414  BP: 140/60  Pulse: 65   Derm - macular erythematous rash groin, scrotum and lower abdomen w/o frank purulence or open excoriations. There is some scabbing.       Assessment & Plan:

## 2013-05-03 NOTE — Patient Instructions (Signed)
Yeast dermatitis - the rash does not look any worse, but also not appreciably better.  Plan Wash at least daily with soap and water, then dry well including use of a low heat hair blower  Complete the doxycycline and diflucan  For itching: continue zantac twice a day and you can add claritin 10 mg twice a day  Return next Monday - if not better will need to see a dermatologist.

## 2013-05-03 NOTE — Assessment & Plan Note (Signed)
No worsening on exam today: no frank purulence or open areas of skin breakdown.  Plan Wash daily  Continue doxycycline and diflucan  Return in 1 week - if no improvement derm consult.

## 2013-05-10 ENCOUNTER — Encounter: Payer: Self-pay | Admitting: Internal Medicine

## 2013-05-10 ENCOUNTER — Ambulatory Visit (INDEPENDENT_AMBULATORY_CARE_PROVIDER_SITE_OTHER): Payer: Medicare Other | Admitting: Internal Medicine

## 2013-05-10 VITALS — BP 130/60 | HR 88 | Wt 157.0 lb

## 2013-05-10 DIAGNOSIS — B372 Candidiasis of skin and nail: Secondary | ICD-10-CM

## 2013-05-10 MED ORDER — SERTRALINE HCL 25 MG PO TABS: 25.0000 mg | ORAL_TABLET | Freq: Every day | ORAL | Status: DC

## 2013-05-10 NOTE — Patient Instructions (Signed)
This rash does look better but not good enough after 2 weeks of treatement Plan - derm referral either Dr. Theotis Barrio office.

## 2013-05-11 ENCOUNTER — Other Ambulatory Visit: Payer: Self-pay | Admitting: Dermatology

## 2013-05-11 NOTE — Assessment & Plan Note (Signed)
Mild improvement but still angry looking rash. No systemic symptoms.  Plan Dermatology consult: referral to Dr. Jorja Loa - marked urgent.

## 2013-05-11 NOTE — Progress Notes (Signed)
  Subjective:    Patient ID: [name], male    DOB: 1931-06-04, 79 y.o.   MRN: [id number]  HPI Follow up visit for extensive rash in the groins, scrotum, penile shaft and lower abdomen. Per his daughter he has not been completely adherent to washing/cleansing regimen but he has taken doxycycline and diflucan. He denies pain, fevers, chills.  PMH, FamHx and SocHx reviewed for any changes and relevance.  Current Outpatient Prescriptions on File Prior to Visit  Medication Sig Dispense Refill  . aspirin 81 MG tablet Take 81 mg by mouth daily.      . cyanocobalamin (,VITAMIN B-12,) 1000 MCG/ML injection Inject 1 mL (1,000 mcg total) into the muscle once.  10 mL  0  . doxycycline (VIBRA-TABS) 100 MG tablet Take 1 tablet (100 mg total) by mouth 2 (two) times daily.  20 tablet  0  . fluconazole (DIFLUCAN) 100 MG tablet Take 1 tablet (100 mg total) by mouth daily.  14 tablet  1   No current facility-administered medications on file prior to visit.      Review of Systems System review is negative for any constitutional, cardiac, pulmonary, GI or neuro symptoms or complaints other than as described in the HPI.     Objective:   Physical Exam Filed Vitals:   05/10/13 1532  BP: 130/60  Pulse: 88   Gen'l - elderly, scrawny man in no distress Derm- decreased erythematous macular rash but lower abdomen remains moderately erythematous with some scabbing and several areas of excoriation.       Assessment & Plan:

## 2013-05-13 ENCOUNTER — Other Ambulatory Visit: Payer: Self-pay

## 2013-10-07 ENCOUNTER — Telehealth: Payer: Self-pay | Admitting: Internal Medicine

## 2013-10-07 NOTE — Telephone Encounter (Signed)
Patient's daughter is calling in regards to the patient's Sertraline(Zoloft) medication. She states that the directions are written as "Take 1 tablet by mouth twice daily" but the patient was instructed to take only once daily. She just needs someone to call pharmacy with verbal so insurance will cover. Marland Kitchen

## 2013-10-07 NOTE — Telephone Encounter (Signed)
I called the pharmacy and let the pharmacist know directions for Sertraline (Zoloft) should be to take 1 tablet by mouth daily #90 plus 3 refills

## 2013-12-13 ENCOUNTER — Encounter: Payer: Self-pay | Admitting: Internal Medicine

## 2013-12-13 ENCOUNTER — Ambulatory Visit (INDEPENDENT_AMBULATORY_CARE_PROVIDER_SITE_OTHER): Payer: Medicare Other | Admitting: Internal Medicine

## 2013-12-13 ENCOUNTER — Other Ambulatory Visit (INDEPENDENT_AMBULATORY_CARE_PROVIDER_SITE_OTHER): Payer: Medicare Other

## 2013-12-13 VITALS — BP 110/42 | HR 92 | Temp 97.4°F | Wt 147.8 lb

## 2013-12-13 DIAGNOSIS — T148 Other injury of unspecified body region: Secondary | ICD-10-CM

## 2013-12-13 DIAGNOSIS — R0602 Shortness of breath: Secondary | ICD-10-CM

## 2013-12-13 DIAGNOSIS — E538 Deficiency of other specified B group vitamins: Secondary | ICD-10-CM

## 2013-12-13 DIAGNOSIS — W57XXXA Bitten or stung by nonvenomous insect and other nonvenomous arthropods, initial encounter: Secondary | ICD-10-CM

## 2013-12-13 DIAGNOSIS — I499 Cardiac arrhythmia, unspecified: Secondary | ICD-10-CM

## 2013-12-13 LAB — BASIC METABOLIC PANEL
BUN: 11 mg/dL (ref 6–23)
CALCIUM: 7.4 mg/dL — AB (ref 8.4–10.5)
CHLORIDE: 102 meq/L (ref 96–112)
CO2: 24 mEq/L (ref 19–32)
Creatinine, Ser: 0.9 mg/dL (ref 0.4–1.5)
GFR: 90.7 mL/min (ref >60.00)
Glucose, Bld: 91 mg/dL (ref 70–99)
Potassium: 3.3 mEq/L — ABNORMAL LOW (ref 3.5–5.1)
SODIUM: 131 meq/L — AB (ref 135–145)

## 2013-12-13 LAB — CBC WITH DIFFERENTIAL/PLATELET
BASOS ABS: 0 10*3/uL (ref 0.0–0.1)
Basophils Relative: 0.5 % (ref 0.0–3.0)
Eosinophils Absolute: 0.6 10*3/uL (ref 0.0–0.7)
Eosinophils Relative: 7.4 % — ABNORMAL HIGH (ref 0.0–5.0)
HCT: 29.8 % — ABNORMAL LOW (ref 39.0–52.0)
Hemoglobin: 10.3 g/dL — ABNORMAL LOW (ref 13.0–17.0)
LYMPHS ABS: 1.1 10*3/uL (ref 0.7–4.0)
Lymphocytes Relative: 14 % (ref 12.0–46.0)
MCHC: 34.4 g/dL (ref 30.0–36.0)
MCV: 93.9 fl (ref 78.0–100.0)
Monocytes Absolute: 0.6 10*3/uL (ref 0.1–1.0)
Monocytes Relative: 8.1 % (ref 3.0–12.0)
Neutro Abs: 5.6 10*3/uL (ref 1.4–7.7)
Neutrophils Relative %: 70 % (ref 43.0–77.0)
PLATELETS: 165 10*3/uL (ref 150.0–400.0)
RBC: 3.18 Mil/uL — ABNORMAL LOW (ref 4.22–5.81)
RDW: 18.8 % — AB (ref 11.5–15.5)
WBC: 8 10*3/uL (ref 4.0–10.5)

## 2013-12-13 MED ORDER — METOPROLOL TARTRATE 25 MG PO TABS: 25.0000 mg | ORAL_TABLET | Freq: Two times a day (BID) | ORAL | Status: DC

## 2013-12-13 MED ORDER — PERMETHRIN 5 % EX CREA: 1.0000 "application " | TOPICAL_CREAM | Freq: Once | CUTANEOUS | Status: DC

## 2013-12-13 NOTE — Patient Instructions (Addendum)
Your next office appointment will be determined based upon review of your pending labs. Those instructions will be transmitted to you through My Chart . 

## 2013-12-13 NOTE — Progress Notes (Signed)
Subjective:    Patient ID: [name], male    DOB: 09/26/1930, 80 y.o.   MRN: [id number]  HPI  For the last 6 months he's had diffuse pruritic symptoms of the skin. There was no definite exposure or trigger. He lives in a retirement facility in his own apartment.  He has used Gold [employer] & Aloe lotion with partial benefit.  He does have a past history of coronary artery disease; these had no recent cardiology followup.      Review of Systems  He has not had B12 injections since at least January of this year. His daughter gives him B12 orally.  He has had a 10 pound weight loss related to food availability in that  facility. Apparently he must eat at restaurants on-campus.  His daughter states that she has noted that his breathing is "heavier" over the last month.       Objective:   Physical Exam  Significant or distinguishing  findings on physical exam are documented first.  Below that are other systems examined & findings.  His daughter is primary historian as he has dementia. Pattern alopecia is present No conjunctivitis or scleral icterus is noted He has upper and lower partials. He has no lymphadenopathy about the head,neck, or axilla. He has scattered minimal rales w/o increased WOB. Irregular, irregular rhythm. There is flaring of the lower thoracic rib cage bilaterally Abdomen scaphoid without organomegaly or masses of the trunk and extremities. Pleasantly demented with very friendly, interactive demeanor. Myriad punctate excoriations of trunk   Gen.:suboptimally nourished in appearance.  Head: Normocephalic without obvious abnormalities Eyes: No corneal or conjunctival inflammation noted. Pupils equal round reactive to light Ears: External  ear exam reveals no significant lesions or deformities.   Nose: External nasal exam reveals no deformity or inflammation. Nasal mucosa are pink and moist. No lesions or exudates noted.   Mouth: Oral mucosa and  oropharynx reveal no lesions or exudates.  Neck: No deformities, masses, or tenderness noted.Thyroid normal. Heart: Normal S1 and S2. No gallop, click, or rub. No murmur. Abdomen: Bowel sounds normal; abdomen soft and nontender. No masses, organomegaly or hernias noted.                                     Musculoskeletal/extremities: No deformity or scoliosis noted of  the thoracic or lumbar spine.  No clubbing, cyanosis, edema, or significant extremity  deformity noted..Tone & strength decreased Hand joints normal  Able to lie down & sit up w/o help. Negative SLR bilaterally Vascular: Carotid, radial artery, dorsalis pedis and  posterior tibial pulses are full and equal. No bruits present. Neurologic:  Gait slow & deliberate     Lymph: No cervical, axillary                                                                                          Assessment & Plan:  #1 diffuse pruritic punctate excoriations suggesting possible bed bug infestation  #2 B12 deficiency, questionable status on oral supplementation  #3 irregular, irregular rhythm with  probable AF on EKG #4 change in respirations probably from #3  Plan see orders and recommendations.

## 2013-12-13 NOTE — Progress Notes (Signed)
Pre visit review using our clinic review tool, if applicable. No additional management support is needed unless otherwise documented below in the visit note. 

## 2013-12-14 LAB — VITAMIN B12: Vitamin B-12: 1316 pg/mL — ABNORMAL HIGH (ref 211–911)

## 2013-12-14 LAB — TSH: TSH: 2.56 u[IU]/mL (ref 0.35–4.50)

## 2013-12-17 ENCOUNTER — Other Ambulatory Visit: Payer: Self-pay | Admitting: Internal Medicine

## 2013-12-17 DIAGNOSIS — D649 Anemia, unspecified: Secondary | ICD-10-CM

## 2014-01-03 ENCOUNTER — Telehealth: Payer: Self-pay | Admitting: *Deleted

## 2014-01-03 NOTE — Telephone Encounter (Signed)
Recommend he make appointment with MD of choice to discuss

## 2014-01-03 NOTE — Telephone Encounter (Signed)
Called [name] no answer LMOM with md response...[name]

## 2014-01-03 NOTE — Telephone Encounter (Signed)
Left msg on triage stating father has not taking his sertraline in a week. Since he is doing good we considering not giving him the medicine. Wanting md recommendations on no longer taking med. Previously was a pt of Dr. Linda Hedges...Johny Chess

## 2014-01-25 ENCOUNTER — Encounter: Payer: Self-pay | Admitting: Internal Medicine

## 2014-01-25 ENCOUNTER — Ambulatory Visit (INDEPENDENT_AMBULATORY_CARE_PROVIDER_SITE_OTHER): Payer: Medicare Other | Admitting: Internal Medicine

## 2014-01-25 VITALS — BP 108/42 | HR 95 | Ht 66.5 in | Wt 147.8 lb

## 2014-01-25 DIAGNOSIS — I480 Paroxysmal atrial fibrillation: Secondary | ICD-10-CM | POA: Insufficient documentation

## 2014-01-25 DIAGNOSIS — I2510 Atherosclerotic heart disease of native coronary artery without angina pectoris: Secondary | ICD-10-CM

## 2014-01-25 DIAGNOSIS — E7800 Pure hypercholesterolemia, unspecified: Secondary | ICD-10-CM

## 2014-01-25 DIAGNOSIS — F1027 Alcohol dependence with alcohol-induced persisting dementia: Secondary | ICD-10-CM | POA: Insufficient documentation

## 2014-01-25 DIAGNOSIS — I10 Essential (primary) hypertension: Secondary | ICD-10-CM

## 2014-01-25 DIAGNOSIS — Z951 Presence of aortocoronary bypass graft: Secondary | ICD-10-CM

## 2014-01-25 DIAGNOSIS — I4891 Unspecified atrial fibrillation: Secondary | ICD-10-CM

## 2014-01-25 NOTE — Progress Notes (Signed)
OFFICE NOTE  Chief Complaint:  Recent PAF (asymptomatic)  Primary Care Physician: Todd Hare, MD  HPI:  [name] is a pleasant 80 year old male who was previously followed by Dr. Mar [name]. 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 Unfortunately he hasn't lost any history smoking and alcohol abuse. He has recently developed a progressive and insidious dementia which is more rapidly progressing. He is recently stopped bathing himself, is not eating as much as he previously did, and stopped taking his medications. He does take care on a daily basis from his daughter who is with him and continues to live in a senior living community.  He is not currently in an assisted living or memory unit environment.  Recently in the office an EKG was performed which demonstrated possible atrial fibrillation versus a sinus rhythm with frequent PACs. It was difficult to make out regular P waves. In the office today his EKG is sinus rhythm with PACs. He is completely unaware of any atrial ectopy. He denies any chest pain or shortness of breath, although is questionable of how reliable this is.  He also has coronary artery disease history as well as hypertension and dyslipidemia and had prior bypass surgery.  PMHx:  Past Medical History  Diagnosis Date  . Coronary atherosclerosis   . Pure hypercholesterolemia   . Unspecified essential hypertension   . Shortness of breath   . Other diseases of lung, not elsewhere classified   . Influenza with other manifestations   . Other general symptoms(780.99)   . Urinary tract infection, site not specified   . Chills (without fever)   . Squamous cell lung cancer     Past Surgical History  Procedure Laterality Date  . Coronary artery bypass graft  2007  . Thoracotomy  2006    right upper lobe  . Appendectomy  1945  . Cholecystectomy  1975  . Tonsillectomy  1942  .  Hemorrhoid surgery  1975    FAMHx:  Family History  Problem Relation Age of Onset  . Alzheimer's disease Father 37    deceased  . Other Mother 62  . Hyperlipidemia Sister   . Coronary artery disease Neg Hx   . Cancer Neg Hx   . Diabetes Neg Hx   . Heart disease Neg Hx     SOCHx:   reports that he quit smoking about 9 years ago. His smoking use included Cigarettes. He has a 106 pack-year smoking history. He has never used smokeless tobacco. He reports that he drinks alcohol. He reports that he does not use illicit drugs.  ALLERGIES:  Allergies  Allergen Reactions  . Cephalexin     ROS: Review of systems not obtained due to patient factors.  HOME MEDS: Current Outpatient Prescriptions  Medication Sig Dispense Refill  . aspirin 81 MG tablet Take 81 mg by mouth daily.       No current facility-administered medications for this visit.    LABS/IMAGING: No results found for this or any previous visit (from the past 48 hour(s)). No results found.  VITALS: BP 108/42  Pulse 95  Ht 5' 6.5" (1.689 m)  Wt 147 lb 12.8 oz (67.042 kg)  BMI 23.50 kg/m2  EXAM: General appearance: alert and no distress Neck: no carotid bruit and no JVD Lungs: clear to auscultation bilaterally Heart: regular rate and rhythm and no murmur, occasional ectopy Abdomen: soft, non-tender; bowel  sounds normal; no masses,  no organomegaly and scaphoid Extremities: extremities normal, atraumatic, no cyanosis or edema Pulses: 2+ and symmetric Skin: Skin color, texture, turgor normal. No rashes or lesions Neurologic: Mental status: Significant memory deficits Psych: Pleasant  EKG:  Sinus rhythm with PACs, nonspecific T-wave changes at 95  ASSESSMENT: 1. Advanced and progressively declining dementia 2. History of coronary artery disease status post CABG in 2007 3. Possible paroxysmal atrial fibrillation 4. Long-standing history of alcohol and tobacco abuse 5. Hypertension-normal blood pressure off  medications 6. Dyslipidemia  PLAN: 1.   [name] apparently has atrial fibrillation by EKG performed in Dr. Clayborn [name] office, however is not totally clear if this is a stable or perhaps frequent PACs. There do appear to be some distinct P waves. Nonetheless if he is having paroxysmal A. fib, he's not really a candidate for more aggressive anticoagulation given his progressive cognitive decline. I would recommend low-dose aspirin which should be beneficial for his prior coronary disease history and some mild benefit for A. fib without increasing the risk of bleeding secondary to falls and other problems that could be anticipated with progressive dementia. I did have a long talk with his daughter today in the office and she seems to agree with this approach. He's come off of all his medications and has even refused some medicines. I don't think it's worthwhile to try to add any additional cardiac medications as if we are to consider his progressive decline with poor eating and the lack of caring for himself, this would carry a prognosis which is quite poor over the next year or 2.  I would be happy to see him back as needed however in this situation I feel that the least aggressive approach is probably the best. Finally, we do have an advanced directive indicating he does not want to be resuscitated and that order should be honored in the future. Per his daughter that these were his wishes prior to his cognitive decline.  Todd Casino, MD, [facility] Attending Cardiologist [facility]  [name] C 01/25/2014, 4:43 PM

## 2014-01-25 NOTE — Patient Instructions (Signed)
Start asprin 81mg  once daily.   Your physician recommends that you schedule a follow-up appointment as needed.

## 2014-03-22 ENCOUNTER — Emergency Department (HOSPITAL_COMMUNITY): Payer: Medicare Other

## 2014-03-22 ENCOUNTER — Inpatient Hospital Stay (HOSPITAL_COMMUNITY)
Admission: EM | Admit: 2014-03-22 | Discharge: 2014-03-25 | DRG: 871 | Disposition: A | Discharge status: Skilled Nursing Facility | Payer: Medicare Other | Attending: Internal Medicine | Admitting: Internal Medicine

## 2014-03-22 ENCOUNTER — Encounter (HOSPITAL_COMMUNITY): Payer: Self-pay | Admitting: Emergency Medicine

## 2014-03-22 DIAGNOSIS — J9600 Acute respiratory failure, unspecified whether with hypoxia or hypercapnia: Secondary | ICD-10-CM | POA: Diagnosis present

## 2014-03-22 DIAGNOSIS — F1027 Alcohol dependence with alcohol-induced persisting dementia: Secondary | ICD-10-CM

## 2014-03-22 DIAGNOSIS — C3491 Malignant neoplasm of unspecified part of right bronchus or lung: Secondary | ICD-10-CM

## 2014-03-22 DIAGNOSIS — E43 Unspecified severe protein-calorie malnutrition: Secondary | ICD-10-CM | POA: Diagnosis present

## 2014-03-22 DIAGNOSIS — G9340 Encephalopathy, unspecified: Secondary | ICD-10-CM | POA: Diagnosis present

## 2014-03-22 DIAGNOSIS — E7800 Pure hypercholesterolemia, unspecified: Secondary | ICD-10-CM | POA: Diagnosis present

## 2014-03-22 DIAGNOSIS — F1020 Alcohol dependence, uncomplicated: Secondary | ICD-10-CM | POA: Diagnosis present

## 2014-03-22 DIAGNOSIS — I10 Essential (primary) hypertension: Secondary | ICD-10-CM | POA: Diagnosis present

## 2014-03-22 DIAGNOSIS — I4891 Unspecified atrial fibrillation: Secondary | ICD-10-CM | POA: Diagnosis present

## 2014-03-22 DIAGNOSIS — J849 Interstitial pulmonary disease, unspecified: Secondary | ICD-10-CM

## 2014-03-22 DIAGNOSIS — A419 Sepsis, unspecified organism: Secondary | ICD-10-CM | POA: Diagnosis present

## 2014-03-22 DIAGNOSIS — R4182 Altered mental status, unspecified: Secondary | ICD-10-CM

## 2014-03-22 DIAGNOSIS — C3490 Malignant neoplasm of unspecified part of unspecified bronchus or lung: Secondary | ICD-10-CM

## 2014-03-22 DIAGNOSIS — D62 Acute posthemorrhagic anemia: Secondary | ICD-10-CM | POA: Diagnosis present

## 2014-03-22 DIAGNOSIS — C7800 Secondary malignant neoplasm of unspecified lung: Secondary | ICD-10-CM | POA: Diagnosis present

## 2014-03-22 DIAGNOSIS — Z87891 Personal history of nicotine dependence: Secondary | ICD-10-CM | POA: Diagnosis not present

## 2014-03-22 DIAGNOSIS — D696 Thrombocytopenia, unspecified: Secondary | ICD-10-CM | POA: Diagnosis present

## 2014-03-22 DIAGNOSIS — R413 Other amnesia: Secondary | ICD-10-CM

## 2014-03-22 DIAGNOSIS — IMO0002 Reserved for concepts with insufficient information to code with codable children: Secondary | ICD-10-CM | POA: Diagnosis not present

## 2014-03-22 DIAGNOSIS — Z951 Presence of aortocoronary bypass graft: Secondary | ICD-10-CM | POA: Diagnosis not present

## 2014-03-22 DIAGNOSIS — N390 Urinary tract infection, site not specified: Secondary | ICD-10-CM

## 2014-03-22 DIAGNOSIS — Z66 Do not resuscitate: Secondary | ICD-10-CM | POA: Diagnosis present

## 2014-03-22 DIAGNOSIS — I2510 Atherosclerotic heart disease of native coronary artery without angina pectoris: Secondary | ICD-10-CM | POA: Diagnosis present

## 2014-03-22 DIAGNOSIS — Z515 Encounter for palliative care: Secondary | ICD-10-CM | POA: Diagnosis not present

## 2014-03-22 DIAGNOSIS — M25531 Pain in right wrist: Secondary | ICD-10-CM

## 2014-03-22 DIAGNOSIS — Z85118 Personal history of other malignant neoplasm of bronchus and lung: Secondary | ICD-10-CM | POA: Diagnosis not present

## 2014-03-22 DIAGNOSIS — R532 Functional quadriplegia: Secondary | ICD-10-CM | POA: Diagnosis present

## 2014-03-22 DIAGNOSIS — D500 Iron deficiency anemia secondary to blood loss (chronic): Secondary | ICD-10-CM | POA: Diagnosis present

## 2014-03-22 DIAGNOSIS — R627 Adult failure to thrive: Secondary | ICD-10-CM | POA: Diagnosis present

## 2014-03-22 DIAGNOSIS — B372 Candidiasis of skin and nail: Secondary | ICD-10-CM

## 2014-03-22 DIAGNOSIS — R1310 Dysphagia, unspecified: Secondary | ICD-10-CM

## 2014-03-22 DIAGNOSIS — J984 Other disorders of lung: Secondary | ICD-10-CM

## 2014-03-22 DIAGNOSIS — E860 Dehydration: Secondary | ICD-10-CM | POA: Diagnosis present

## 2014-03-22 DIAGNOSIS — F29 Unspecified psychosis not due to a substance or known physiological condition: Secondary | ICD-10-CM | POA: Diagnosis present

## 2014-03-22 DIAGNOSIS — R911 Solitary pulmonary nodule: Secondary | ICD-10-CM

## 2014-03-22 DIAGNOSIS — C801 Malignant (primary) neoplasm, unspecified: Secondary | ICD-10-CM

## 2014-03-22 DIAGNOSIS — F329 Major depressive disorder, single episode, unspecified: Secondary | ICD-10-CM

## 2014-03-22 DIAGNOSIS — R9389 Abnormal findings on diagnostic imaging of other specified body structures: Secondary | ICD-10-CM

## 2014-03-22 DIAGNOSIS — F32A Depression, unspecified: Secondary | ICD-10-CM

## 2014-03-22 DIAGNOSIS — F0390 Unspecified dementia without behavioral disturbance: Secondary | ICD-10-CM

## 2014-03-22 DIAGNOSIS — R0602 Shortness of breath: Secondary | ICD-10-CM

## 2014-03-22 DIAGNOSIS — F03C0 Unspecified dementia, severe, without behavioral disturbance, psychotic disturbance, mood disturbance, and anxiety: Secondary | ICD-10-CM | POA: Diagnosis present

## 2014-03-22 DIAGNOSIS — I480 Paroxysmal atrial fibrillation: Secondary | ICD-10-CM | POA: Diagnosis present

## 2014-03-22 LAB — URINALYSIS, ROUTINE W REFLEX MICROSCOPIC
Glucose, UA: NEGATIVE mg/dL
Hgb urine dipstick: NEGATIVE
Ketones, ur: NEGATIVE mg/dL
NITRITE: POSITIVE — AB
PH: 6.5 (ref 5.0–8.0)
Protein, ur: 30 mg/dL — AB
SPECIFIC GRAVITY, URINE: 1.023 (ref 1.005–1.030)
Urobilinogen, UA: 1 mg/dL (ref 0.0–1.0)

## 2014-03-22 LAB — COMPREHENSIVE METABOLIC PANEL
ALBUMIN: 2 g/dL — AB (ref 3.5–5.2)
ALK PHOS: 90 U/L (ref 39–117)
ALT: 9 U/L (ref 0–53)
AST: 27 U/L (ref 0–37)
Anion gap: 10 (ref 5–15)
BUN: 28 mg/dL — ABNORMAL HIGH (ref 6–23)
CO2: 23 mEq/L (ref 19–32)
Calcium: 8.1 mg/dL — ABNORMAL LOW (ref 8.4–10.5)
Chloride: 109 mEq/L (ref 96–112)
Creatinine, Ser: 1.16 mg/dL (ref 0.50–1.35)
GFR calc Af Amer: 66 mL/min — ABNORMAL LOW (ref >90)
GFR calc non Af Amer: 57 mL/min — ABNORMAL LOW (ref >90)
Glucose, Bld: 88 mg/dL (ref 70–99)
POTASSIUM: 4 meq/L (ref 3.7–5.3)
SODIUM: 142 meq/L (ref 137–147)
TOTAL PROTEIN: 8.1 g/dL (ref 6.0–8.3)
Total Bilirubin: 1.3 mg/dL — ABNORMAL HIGH (ref 0.3–1.2)

## 2014-03-22 LAB — CBC WITH DIFFERENTIAL/PLATELET
BASOS PCT: 0 % (ref 0–1)
Basophils Absolute: 0.1 10*3/uL (ref 0.0–0.1)
EOS ABS: 0.5 10*3/uL (ref 0.0–0.7)
Eosinophils Relative: 4 % (ref 0–5)
HCT: 31 % — ABNORMAL LOW (ref 39.0–52.0)
Hemoglobin: 10.7 g/dL — ABNORMAL LOW (ref 13.0–17.0)
Lymphocytes Relative: 9 % — ABNORMAL LOW (ref 12–46)
Lymphs Abs: 1.1 10*3/uL (ref 0.7–4.0)
MCH: 35.3 pg — ABNORMAL HIGH (ref 26.0–34.0)
MCHC: 34.5 g/dL (ref 30.0–36.0)
MCV: 102.3 fL — ABNORMAL HIGH (ref 78.0–100.0)
Monocytes Absolute: 1 10*3/uL (ref 0.1–1.0)
Monocytes Relative: 8 % (ref 3–12)
NEUTROS ABS: 9.9 10*3/uL — AB (ref 1.7–7.7)
NEUTROS PCT: 79 % — AB (ref 43–77)
Platelets: 113 10*3/uL — ABNORMAL LOW (ref 150–400)
RBC: 3.03 MIL/uL — AB (ref 4.22–5.81)
RDW: 18 % — ABNORMAL HIGH (ref 11.5–15.5)
WBC: 12.5 10*3/uL — ABNORMAL HIGH (ref 4.0–10.5)

## 2014-03-22 LAB — PROTIME-INR
INR: 1.91 — AB (ref 0.00–1.49)
Prothrombin Time: 21.9 seconds — ABNORMAL HIGH (ref 11.6–15.2)

## 2014-03-22 LAB — URINE MICROSCOPIC-ADD ON

## 2014-03-22 LAB — LACTIC ACID, PLASMA: Lactic Acid, Venous: 3.4 mmol/L — ABNORMAL HIGH (ref 0.5–2.2)

## 2014-03-22 MED ORDER — CIPROFLOXACIN IN D5W 400 MG/200ML IV SOLN
400.0000 mg | Freq: Two times a day (BID) | INTRAVENOUS | Status: DC
  Administered 2014-03-23: 400 mg via INTRAVENOUS
  Filled 2014-03-22 (×2): qty 200

## 2014-03-22 MED ORDER — SODIUM CHLORIDE 0.9 % IV SOLN: INTRAVENOUS | Status: AC

## 2014-03-22 MED ORDER — ALBUTEROL SULFATE (2.5 MG/3ML) 0.083% IN NEBU
2.5000 mg | INHALATION_SOLUTION | RESPIRATORY_TRACT | Status: DC | PRN
Start: 2014-03-22 — End: 2014-03-25

## 2014-03-22 MED ORDER — ACETAMINOPHEN 650 MG RE SUPP: 650.0000 mg | Freq: Four times a day (QID) | RECTAL | Status: DC | PRN

## 2014-03-22 MED ORDER — SODIUM CHLORIDE 0.9 % IV SOLN
1000.0000 mL | Freq: Once | INTRAVENOUS | Status: AC
  Administered 2014-03-22: 1000 mL via INTRAVENOUS

## 2014-03-22 MED ORDER — DOCUSATE SODIUM 100 MG PO CAPS
100.0000 mg | ORAL_CAPSULE | Freq: Two times a day (BID) | ORAL | Status: DC
  Administered 2014-03-22 – 2014-03-25 (×6): 100 mg via ORAL
  Filled 2014-03-22 (×7): qty 1

## 2014-03-22 MED ORDER — ONDANSETRON HCL 4 MG/2ML IJ SOLN
4.0000 mg | Freq: Four times a day (QID) | INTRAMUSCULAR | Status: DC | PRN
Start: 2014-03-22 — End: 2014-03-25

## 2014-03-22 MED ORDER — OXYCODONE HCL 5 MG PO TABS
5.0000 mg | ORAL_TABLET | ORAL | Status: DC | PRN
  Administered 2014-03-24: 5 mg via ORAL
  Filled 2014-03-22: qty 1

## 2014-03-22 MED ORDER — CIPROFLOXACIN IN D5W 400 MG/200ML IV SOLN
400.0000 mg | Freq: Once | INTRAVENOUS | Status: AC
  Administered 2014-03-22: 400 mg via INTRAVENOUS
  Filled 2014-03-22: qty 200

## 2014-03-22 MED ORDER — METOPROLOL TARTRATE 12.5 MG HALF TABLET
12.5000 mg | ORAL_TABLET | Freq: Two times a day (BID) | ORAL | Status: DC
  Administered 2014-03-22 – 2014-03-25 (×5): 12.5 mg via ORAL
  Filled 2014-03-22 (×7): qty 1

## 2014-03-22 MED ORDER — ENOXAPARIN SODIUM 40 MG/0.4ML ~~LOC~~ SOLN
40.0000 mg | SUBCUTANEOUS | Status: DC
  Administered 2014-03-22: 40 mg via SUBCUTANEOUS
  Filled 2014-03-22 (×4): qty 0.4

## 2014-03-22 MED ORDER — SODIUM CHLORIDE 0.9 % IV SOLN
1000.0000 mL | INTRAVENOUS | Status: DC
  Administered 2014-03-22: 1000 mL via INTRAVENOUS

## 2014-03-22 MED ORDER — ACETAMINOPHEN 325 MG PO TABS: 650.0000 mg | ORAL_TABLET | Freq: Four times a day (QID) | ORAL | Status: DC | PRN

## 2014-03-22 MED ORDER — ONDANSETRON HCL 4 MG PO TABS
4.0000 mg | ORAL_TABLET | Freq: Four times a day (QID) | ORAL | Status: DC | PRN
Start: 2014-03-22 — End: 2014-03-25

## 2014-03-22 MED ORDER — THIAMINE HCL 100 MG/ML IJ SOLN
100.0000 mg | Freq: Every day | INTRAMUSCULAR | Status: DC
  Administered 2014-03-22 – 2014-03-23 (×2): 100 mg via INTRAVENOUS
  Filled 2014-03-22 (×3): qty 1

## 2014-03-22 MED ORDER — MORPHINE SULFATE 2 MG/ML IJ SOLN: 1.0000 mg | INTRAMUSCULAR | Status: DC | PRN

## 2014-03-22 NOTE — Progress Notes (Signed)
Clinical Social Work Department BRIEF PSYCHOSOCIAL ASSESSMENT 03/22/2014  Patient:  [name], [name]     Account Number:  [id number]     Admit date:  03/22/2014  Clinical Social Worker:  Luretha Rued  Date/Time:  03/22/2014 04:30 PM  Referred by:  CSW  Date Referred:  03/22/2014  Other Referral:   Interview type:  Family Other interview type:   Patient has dementia and is at bedside.    PSYCHOSOCIAL DATA Living Status:  FACILITY Admitted from facility:   Level of care:  Independent Living Primary support name:  [name] Primary support relationship to patient:  CHILD, ADULT Degree of support available:   High Level of support    CURRENT CONCERNS  Other Concerns:    SOCIAL WORK ASSESSMENT / PLAN CSW attemped to assess the patient but he was not easy to wake and has dementia.  CSW contacted daughter/POA over the phone. She reports that her father lives in [place] independent living and she would like for him relocate to a higher level of care.  She would like her father to be placed at [facility] if possbile or a similar facility.   Assessment/plan status:  Psychosocial Support/Ongoing Assessment of Needs Other assessment/ plan:   Information/referral to community resources:   None at this time. Patient was moved to the floor before resources was offered.    PATIENT'S/FAMILY'S RESPONSE TO PLAN OF CARE: Daughter expressed her appreciation of the support from the social work department.    Chesley Noon, MSW, [place], 03/22/2014 Evening Clinical Social Worker [phone]

## 2014-03-22 NOTE — ED Notes (Addendum)
CLEARED FROM SPINAL IMMOBILIZATION BY [facility] EDP ASSISTED BY Nadia Torr RN AND ALICIA NT. FAMILY DAUGHTER PRESENT. C-COLLAR REMAINS INTACT

## 2014-03-22 NOTE — ED Notes (Signed)
Per EMS- found laying on floor in front of chair he's been sleeping in for the last few nights. Unsure how long he has been lying in front of the chair. A month ago was able to walk down to cafeteria but ability to complete ADLs has progressively gotten worse over the last month. Hx advanced dementia. Pt groaning in pain on palpation to back and neck. Cervical collar in place and KED in place. No obvious deformities. Pulse appears to be afib. Daughter mentioned PCP said patient was in afib but pt is currently not on any medications/treatment. VS: BP: 115/46 HR 104 RR 28 unlabored SpO2 94% on RA-placed on 4L O2 d/t 84% SpO2. CBG 114. Hx CABG, right lower lung lobectomy.

## 2014-03-22 NOTE — ED Notes (Signed)
Family at bedside. 

## 2014-03-22 NOTE — H&P (Signed)
[facility] History and Physical  [name] ANV:[id number] DOB: [birth year] DOA: 03/22/2014   PCP: Adella Hare, MD  Specialists: Dr. Debara Pickett is his cardiologist  Chief Complaint: Worsening confusion over the last 3-4 days   HPI: [name] is a 80 y.o. male with a past medical history of coronary artery disease, status post CABG, history of lung cancer in 2006 status post surgical resection (no chemotherapy), dementia, due to alcoholism, paroxysmal atrial fibrillation, who is brought into the emergency department by his daughter. Patient was diagnosed with dementia about 2-1/2 years ago. During that time frame patient has been declining. Patient still lives in an independent retirement community. However, in the last 2-3 months he has been unable to do any of his usual activities. Unable to do his ADLs. He has not been able to go down to eat his meals. His daughter has been bringing him his meals. She has noticed that his confusion has worsened. His dementia has been progressing. There has been no history of fever or chills. No nausea, vomiting, or diarrhea. He's had poor oral intake the last many days. And, then about 2-3 days ago he has been unable to even get up from his bed. She has noticed some wheezing. Has chronic cough. Currently, the patient's daughter he was losing weight, but over the last month or so he appears to have stabilized. Unable to quantify the weight loss. Patient unable to provide any history due to his dementia.    Home Medications: Prior to Admission medications   Not on File  It appears that patient has stopped taking many of his medications over the past 2 months.   Allergies:  Allergies  Allergen Reactions  . Cephalexin     unknown    Past Medical History: Past Medical History  Diagnosis Date  . Coronary atherosclerosis   . Pure hypercholesterolemia   . Unspecified essential hypertension   . Shortness of breath   . Other diseases of  lung, not elsewhere classified   . Influenza with other manifestations   . Other general symptoms(780.99)   . Urinary tract infection, site not specified   . Chills (without fever)   . Squamous cell lung cancer     Past Surgical History  Procedure Laterality Date  . Coronary artery bypass graft  2007  . Thoracotomy  2006    right upper lobe  . Appendectomy  1945  . Cholecystectomy  1975  . Tonsillectomy  1942  . Hemorrhoid surgery  1975    Social History: Patient lives in a retirement community. He was a heavy smoker up until 2006. Used to smoke 2-3 packs per day and smoked for about 50 or years. He also was a heavy alcoholic till about 4 or 5 years ago.  Family History:  Family History  Problem Relation Age of Onset  . Alzheimer's disease Father 107    deceased  . Other Mother 56  . Hyperlipidemia Sister   . Coronary artery disease Neg Hx   . Cancer Neg Hx   . Diabetes Neg Hx   . Heart disease Neg Hx      Review of Systems - unable to do due to his altered mental status  Physical Examination  Filed Vitals:   03/22/14 1430 03/22/14 1500 03/22/14 1515 03/22/14 1530  BP: 115/53 117/54 114/54 120/47  Pulse: 58 108 106 103  Temp: 97.5 F (36.4 C)     TempSrc: Rectal     Resp: 20 24 16  18  SpO2: 98% 100% 100% 100%    BP 120/47  Pulse 103  Temp(Src) 97.5 F (36.4 C) (Rectal)  Resp 18  SpO2 100%  General appearance: alert, appears stated age, cachectic, distracted and no distress Head: sunken eyes. normocephalic Eyes: conjunctivae/corneas clear. PERRL, EOM's intact. Throat: Extremely dry Mucous membranes. Neck: no adenopathy, no carotid bruit, no JVD, supple, symmetrical, trachea midline and thyroid not enlarged, symmetric, no tenderness/mass/nodules Resp: Rhonchi heard right lung. Crackles at the bases. No wheezing is appreciated. Cardio: S1-S2 is irregularly irregular. No S3, S4. No rubs, murmurs, or bruit. No pedal edema. No JVD. GI: soft, non-tender; bowel  sounds normal; no masses,  no organomegaly Extremities: extremities normal, atraumatic, no cyanosis or edema Pulses: 2+ and symmetric Skin: Skin color, texture, turgor normal. No rashes or lesions Lymph nodes: Cervical, supraclavicular, and axillary nodes normal. Neurologic: Alert. Disoriented. No focal deficits.  Laboratory Data: Results for orders placed during the hospital encounter of 03/22/14 (from the past 48 hour(s))  CBC WITH DIFFERENTIAL     Status: Abnormal   Collection Time    03/22/14  1:17 PM      Result Value Ref Range   WBC 12.5 (*) 4.0 - 10.5 K/uL   RBC 3.03 (*) 4.22 - 5.81 MIL/uL   Hemoglobin 10.7 (*) 13.0 - 17.0 g/dL   HCT 31.0 (*) 39.0 - 52.0 %   MCV 102.3 (*) 78.0 - 100.0 fL   MCH 35.3 (*) 26.0 - 34.0 pg   MCHC 34.5  30.0 - 36.0 g/dL   RDW 18.0 (*) 11.5 - 15.5 %   Platelets 113 (*) 150 - 400 K/uL   Comment: SPECIMEN CHECKED FOR CLOTS     REPEATED TO VERIFY     PLATELET COUNT CONFIRMED BY SMEAR   Neutrophils Relative % 79 (*) 43 - 77 %   Neutro Abs 9.9 (*) 1.7 - 7.7 K/uL   Lymphocytes Relative 9 (*) 12 - 46 %   Lymphs Abs 1.1  0.7 - 4.0 K/uL   Monocytes Relative 8  3 - 12 %   Monocytes Absolute 1.0  0.1 - 1.0 K/uL   Eosinophils Relative 4  0 - 5 %   Eosinophils Absolute 0.5  0.0 - 0.7 K/uL   Basophils Relative 0  0 - 1 %   Basophils Absolute 0.1  0.0 - 0.1 K/uL  COMPREHENSIVE METABOLIC PANEL     Status: Abnormal   Collection Time    03/22/14  1:17 PM      Result Value Ref Range   Sodium 142  137 - 147 mEq/L   Potassium 4.0  3.7 - 5.3 mEq/L   Chloride 109  96 - 112 mEq/L   CO2 23  19 - 32 mEq/L   Glucose, Bld 88  70 - 99 mg/dL   BUN 28 (*) 6 - 23 mg/dL   Creatinine, Ser 1.16  0.50 - 1.35 mg/dL   Calcium 8.1 (*) 8.4 - 10.5 mg/dL   Total Protein 8.1  6.0 - 8.3 g/dL   Albumin 2.0 (*) 3.5 - 5.2 g/dL   AST 27  0 - 37 U/L   ALT 9  0 - 53 U/L   Alkaline Phosphatase 90  39 - 117 U/L   Total Bilirubin 1.3 (*) 0.3 - 1.2 mg/dL   GFR calc non Af Amer 57 (*)  >90 mL/min   GFR calc Af Amer 66 (*) >90 mL/min   Comment: (NOTE)     The eGFR has  been calculated using the CKD EPI equation.     This calculation has not been validated in all clinical situations.     eGFR's persistently <90 mL/min signify possible Chronic Kidney     Disease.   Anion gap 10  5 - 15  LACTIC ACID, PLASMA     Status: Abnormal   Collection Time    03/22/14  1:17 PM      Result Value Ref Range   Lactic Acid, Venous 3.4 (*) 0.5 - 2.2 mmol/L  PROTIME-INR     Status: Abnormal   Collection Time    03/22/14  1:17 PM      Result Value Ref Range   Prothrombin Time 21.9 (*) 11.6 - 15.2 seconds   INR 1.91 (*) 0.00 - 1.49  URINALYSIS, ROUTINE W REFLEX MICROSCOPIC     Status: Abnormal   Collection Time    03/22/14  1:56 PM      Result Value Ref Range   Color, Urine AMBER (*) YELLOW   Comment: BIOCHEMICALS MAY BE AFFECTED BY COLOR   APPearance CLOUDY (*) CLEAR   Specific Gravity, Urine 1.023  1.005 - 1.030   pH 6.5  5.0 - 8.0   Glucose, UA NEGATIVE  NEGATIVE mg/dL   Hgb urine dipstick NEGATIVE  NEGATIVE   Bilirubin Urine SMALL (*) NEGATIVE   Ketones, ur NEGATIVE  NEGATIVE mg/dL   Protein, ur 30 (*) NEGATIVE mg/dL   Urobilinogen, UA 1.0  0.0 - 1.0 mg/dL   Nitrite POSITIVE (*) NEGATIVE   Leukocytes, UA TRACE (*) NEGATIVE  URINE MICROSCOPIC-ADD ON     Status: Abnormal   Collection Time    03/22/14  1:56 PM      Result Value Ref Range   Squamous Epithelial / LPF RARE  RARE   WBC, UA 3-6  <3 WBC/hpf   RBC / HPF 0-2  <3 RBC/hpf   Bacteria, UA MANY (*) RARE    Radiology Reports: Ct Head Wo Contrast  03/22/2014   CLINICAL DATA:  Status post fall with patient found on the floor ; history of dementia  EXAM: CT HEAD WITHOUT CONTRAST  TECHNIQUE: Contiguous axial images were obtained from the base of the skull through the vertex without intravenous contrast.  COMPARISON:  Noncontrast CT scan of the brain of April 26, 2010  FINDINGS: There is moderate diffuse cerebral and  cerebellar atrophy with compensatory ventriculomegaly. This has progressed mildly since the previous study. The temporal lobes are most significantly involved by the atrophy. There is no intracranial hemorrhage. There is no acute ischemic change. The cerebellum and brainstem exhibit no acute abnormalities. The globes are grossly intact.  The observed paranasal sinuses and mastoid air cells are clear. There is no acute skull fracture. A small cephalohematoma over the posterior right parietal region is noted.  IMPRESSION: 1. There is no acute intracranial hemorrhage nor evidence of acute ischemic change. 2.  Progressive moderate diffuse cerebral and cerebellar atrophy. 3. There is no acute skull fracture.   Electronically Signed   By: David  [place]   On: 03/22/2014 13:41   Ct Chest Wo Contrast  03/22/2014   CLINICAL DATA:  Lung disease  EXAM: CT CHEST WITHOUT CONTRAST  TECHNIQUE: Multidetector CT imaging of the chest was performed following the standard protocol without IV contrast.  COMPARISON:  08/27/2012  FINDINGS: Lack of intravenous contrast markedly limits the evaluation, especially in the mediastinum.  Abnormal mediastinal and left supraclavicular adenopathy is present. Lymph nodes are difficult to measure. 14  mm precarinal node on image 24, 10 mm prevascular node on image 26, suspicious sub carinal soft tissue density on image 33, and 12 mm left supraclavicular node on image 11. Several other abnormal lymph nodes are present in the mediastinum.  Extensive coronary artery calcifications.  Status post sternotomy.  Small left pleural effusion.  Confluent pulmonary density is present worrisome for pulmonary mass or malignancy. There is a peripheral mass on image 31 measuring 2.8 x 5.4 cm. The adjacent rib on image 30 has an abnormal appearance worrisome for bony invasion. 20 mm left lower lobe mass on image 36. 4.6 x 6.0 central right lung mass on image 29. There is prominent soft tissue in the left hilum on  image 32. Adenopathy is not excluded.  Ascites. Cirrhotic liver. 17 mm gastrohepatic ligament node on image 58. Several other small nodes are present but are difficult to delineate.  Right and left adrenal mass is are 2.7 and 5.0 cm.  IMPRESSION: Findings are worrisome for metastatic bronchogenic carcinoma. There is a left upper lobe mass with suspected overlying rib destruction. Other lung masses are noted. Adenopathy in the neck, chest, and abdomen is present. Bilateral adrenal masses are noted. Repeat CT of the chest and abdomen with contrast is recommended. Also, consider PET-CT.  Cirrhosis with ascites.   Electronically Signed   By: Maryclare Bean M.D.   On: 03/22/2014 14:41   Dg Chest Port 1 View  03/22/2014   CLINICAL DATA:  Weakness and hypoxia  EXAM: PORTABLE CHEST - 1 VIEW  COMPARISON:  PA and lateral chest of September 06, 2009 and CT scan of the chest of August 27, 2012  FINDINGS: There is chronic elevation of the right hemidiaphragm. The interstitial markings of both lungs are increased which is a new finding. There is hilar prominence bilaterally. The cardiac silhouette is normal in size. The pulmonary vascularity is not engorged. There are 10 sternal wires present. These are intact. There surgical clips in the right hilum, overlying the inferior aspect of the cardiac silhouette to the left of midline, and over the right lower lateral thoracic wall. No acute abnormality of the bony thorax is demonstrated.  IMPRESSION: Mildly increased pulmonary interstitial markings may reflect pneumonia or CHF or a combination of both. The possibility of lymphangitic spread of malignancy on the left is raised. Follow-up chest CT scanning is recommended.   Electronically Signed   By: David  [place]   On: 03/22/2014 13:10    Electrocardiogram: Poor quality EKG. Unable to determine rhythm. PVCs are seen.  Problem List  Principal Problem:   UTI (lower urinary tract infection) Active Problems:   CORONARY ARTERY  DISEASE   Dementia associated with alcoholism   PAF (paroxysmal atrial fibrillation)   S/P CABG (coronary artery bypass graft)   Advanced dementia   Dehydration   Severe protein-calorie malnutrition   Assessment: This is an 80 year old, Caucasian male, who presents with worsening mental status, and is found to have an abnormal, UA, suggesting urinary tract infection. He had a CT chest, which suggests metastatic bronchogenic carcinoma. He appears to have advanced dementia as well.  Plan: #1 acute encephalopathy: Likely secondary to UTI superimposed on dementia. CT head does not show any acute findings. Significant atrophy is noted.  #2 urinary tract infection: He'll be treated with ciprofloxacin. Urine cultures will be followed up.  #3 Dehydration: Will be given IV fluids.  #4 new metastatic bronchogenic carcinoma in patient with previous history of lung cancer: Have discussed this finding in  detail with the patient's daughter. Considering his advanced dementia he is not a candidate for further testing or treatment. She agrees. We will at this time proceed with palliative medicine consultation. It is quite likely patient is eligible for hospice. Symptom management will be the mainstay.  #5 paroxysmal atrial fibrillation: He is mildly tachycardic. He used to be on a beta blocker, but hasn't taken it in over a month. We will start him on a low-dose metoprolol. No need to monitor him on telemetry. Not a candidate for anticoagulation.  #6 advanced dementia: Most likely secondary to his history of alcoholism. Progressing quite rapidly according to the daughter. Considering his newly diagnosed cancer and advanced dementia, palliative medicine team input has been sought.  #7 history of coronary artery disease, status post CABG: Stable  DVT Prophylaxis: Enoxaparin Code Status: DO NOT RESUSCITATE Family Communication: Care was discussed in detail with the patient's daughter  Disposition Plan:  Admit to MedSurg   Further management decisions will depend on results of further testing and patient's response to treatment.   [facility]  [facility] Pager [phone]  If 7PM-7AM, please contact night-coverage [email or web address] Password [facility]  03/22/2014, 4:43 PM  Disclaimer: This note was dictated with voice recognition software. Similar sounding words can inadvertently be transcribed and may not be corrected upon review.

## 2014-03-22 NOTE — ED Provider Notes (Signed)
CSN: [id number]     Arrival date & time 03/22/14  1151 History   First MD Initiated Contact with Patient 03/22/14 1205     Chief Complaint  Patient presents with  . Fall?   . Dementia     (Consider location/radiation/quality/duration/timing/severity/associated sxs/prior Treatment) HPI Patient presents with his daughter who provides much of the history of present illness. Patient has multiple medical issues, including progressive dementia, LEVEL 5 caveat. Per report the patient was found at the base of a chair.  He was last seen in his usual state of health yesterday.  One duration of downtime is unknown. On arrival of the daughter to the patient's house, he was complaining of back pain, appeared to have a labored breathing. Per EMS report the patient had tachypnea, tachycardia, hypoxia on their arrival. Patient complains of pain in his back, but is unable to provide other details of the episode.  Past Medical History  Diagnosis Date  . Coronary atherosclerosis   . Pure hypercholesterolemia   . Unspecified essential hypertension   . Shortness of breath   . Other diseases of lung, not elsewhere classified   . Influenza with other manifestations   . Other general symptoms(780.99)   . Urinary tract infection, site not specified   . Chills (without fever)   . Squamous cell lung cancer    Past Surgical History  Procedure Laterality Date  . Coronary artery bypass graft  2007  . Thoracotomy  2006    right upper lobe  . Appendectomy  1945  . Cholecystectomy  1975  . Tonsillectomy  1942  . Hemorrhoid surgery  1975   Family History  Problem Relation Age of Onset  . Alzheimer's disease Father 10    deceased  . Other Mother 80  . Hyperlipidemia Sister   . Coronary artery disease Neg Hx   . Cancer Neg Hx   . Diabetes Neg Hx   . Heart disease Neg Hx    History  Substance Use Topics  . Smoking status: Former Smoker -- 2.00 packs/day for 53 years    Types: Cigarettes    Quit  date: 06/07/2004  . Smokeless tobacco: Never Used  . Alcohol Use: Yes     Comment: 1-2 glasses of wine per day---pt used to drink 2-3 bottles daily of wine, decreased to 1 bottle daily, now down to 1-2 glasses daily    Review of Systems  Unable to perform ROS: Dementia      Allergies  Cephalexin  Home Medications   Prior to Admission medications   Not on File   BP 115/53  Pulse 58  Temp(Src) 97.5 F (36.4 C) (Rectal)  Resp 20  SpO2 98% Physical Exam  Nursing note and vitals reviewed. Constitutional: He is oriented to person, place, and time. He appears cachectic. He has a sickly appearance. He appears ill.  HENT:  Head: Normocephalic and atraumatic.  Dry mucous membranes  Eyes: Conjunctivae and EOM are normal. Right eye exhibits no discharge. Left eye exhibits no discharge.  Neck: No tracheal deviation present.  Cardiovascular: Regular rhythm.  Tachycardia present.   Pulmonary/Chest: Breath sounds normal. Accessory muscle usage present. No stridor. Tachypnea noted. No respiratory distress.  Abdominal: He exhibits no distension.  Musculoskeletal: He exhibits no edema.  Neurological: He is alert and oriented to person, place, and time.  Diffuse atrophy, moves all extremities spontaneously, though minimally, does not follow commands for neurologic exam.  Skin: Skin is warm and dry.  Psychiatric: He is withdrawn.  Cognition and memory are impaired.    ED Course  Procedures (including critical care time) Labs Review Labs Reviewed  CBC WITH DIFFERENTIAL - Abnormal; Notable for the following:    WBC 12.5 (*)    RBC 3.03 (*)    Hemoglobin 10.7 (*)    HCT 31.0 (*)    MCV 102.3 (*)    MCH 35.3 (*)    RDW 18.0 (*)    Platelets 113 (*)    Neutrophils Relative % 79 (*)    Neutro Abs 9.9 (*)    Lymphocytes Relative 9 (*)    All other components within normal limits  COMPREHENSIVE METABOLIC PANEL - Abnormal; Notable for the following:    BUN 28 (*)    Calcium 8.1 (*)     Albumin 2.0 (*)    Total Bilirubin 1.3 (*)    GFR calc non Af Amer 57 (*)    GFR calc Af Amer 66 (*)    All other components within normal limits  LACTIC ACID, PLASMA - Abnormal; Notable for the following:    Lactic Acid, Venous 3.4 (*)    All other components within normal limits  PROTIME-INR - Abnormal; Notable for the following:    Prothrombin Time 21.9 (*)    INR 1.91 (*)    All other components within normal limits  URINALYSIS, ROUTINE W REFLEX MICROSCOPIC - Abnormal; Notable for the following:    Color, Urine AMBER (*)    APPearance CLOUDY (*)    Bilirubin Urine SMALL (*)    Protein, ur 30 (*)    Nitrite POSITIVE (*)    Leukocytes, UA TRACE (*)    All other components within normal limits  URINE MICROSCOPIC-ADD ON - Abnormal; Notable for the following:    Bacteria, UA MANY (*)    All other components within normal limits    Imaging Review Ct Head Wo Contrast  03/22/2014   CLINICAL DATA:  Status post fall with patient found on the floor ; history of dementia  EXAM: CT HEAD WITHOUT CONTRAST  TECHNIQUE: Contiguous axial images were obtained from the base of the skull through the vertex without intravenous contrast.  COMPARISON:  Noncontrast CT scan of the brain of April 26, 2010  FINDINGS: There is moderate diffuse cerebral and cerebellar atrophy with compensatory ventriculomegaly. This has progressed mildly since the previous study. The temporal lobes are most significantly involved by the atrophy. There is no intracranial hemorrhage. There is no acute ischemic change. The cerebellum and brainstem exhibit no acute abnormalities. The globes are grossly intact.  The observed paranasal sinuses and mastoid air cells are clear. There is no acute skull fracture. A small cephalohematoma over the posterior right parietal region is noted.  IMPRESSION: 1. There is no acute intracranial hemorrhage nor evidence of acute ischemic change. 2.  Progressive moderate diffuse cerebral and cerebellar  atrophy. 3. There is no acute skull fracture.   Electronically Signed   By: David  [place]   On: 03/22/2014 13:41   Ct Chest Wo Contrast  03/22/2014   CLINICAL DATA:  Lung disease  EXAM: CT CHEST WITHOUT CONTRAST  TECHNIQUE: Multidetector CT imaging of the chest was performed following the standard protocol without IV contrast.  COMPARISON:  08/27/2012  FINDINGS: Lack of intravenous contrast markedly limits the evaluation, especially in the mediastinum.  Abnormal mediastinal and left supraclavicular adenopathy is present. Lymph nodes are difficult to measure. 14 mm precarinal node on image 24, 10 mm prevascular node on image 26, suspicious  sub carinal soft tissue density on image 33, and 12 mm left supraclavicular node on image 11. Several other abnormal lymph nodes are present in the mediastinum.  Extensive coronary artery calcifications.  Status post sternotomy.  Small left pleural effusion.  Confluent pulmonary density is present worrisome for pulmonary mass or malignancy. There is a peripheral mass on image 31 measuring 2.8 x 5.4 cm. The adjacent rib on image 30 has an abnormal appearance worrisome for bony invasion. 20 mm left lower lobe mass on image 36. 4.6 x 6.0 central right lung mass on image 29. There is prominent soft tissue in the left hilum on image 32. Adenopathy is not excluded.  Ascites. Cirrhotic liver. 17 mm gastrohepatic ligament node on image 58. Several other small nodes are present but are difficult to delineate.  Right and left adrenal mass is are 2.7 and 5.0 cm.  IMPRESSION: Findings are worrisome for metastatic bronchogenic carcinoma. There is a left upper lobe mass with suspected overlying rib destruction. Other lung masses are noted. Adenopathy in the neck, chest, and abdomen is present. Bilateral adrenal masses are noted. Repeat CT of the chest and abdomen with contrast is recommended. Also, consider PET-CT.  Cirrhosis with ascites.   Electronically Signed   By: Maryclare Bean M.D.   On:  03/22/2014 14:41   Dg Chest Port 1 View  03/22/2014   CLINICAL DATA:  Weakness and hypoxia  EXAM: PORTABLE CHEST - 1 VIEW  COMPARISON:  PA and lateral chest of September 06, 2009 and CT scan of the chest of August 27, 2012  FINDINGS: There is chronic elevation of the right hemidiaphragm. The interstitial markings of both lungs are increased which is a new finding. There is hilar prominence bilaterally. The cardiac silhouette is normal in size. The pulmonary vascularity is not engorged. There are 10 sternal wires present. These are intact. There surgical clips in the right hilum, overlying the inferior aspect of the cardiac silhouette to the left of midline, and over the right lower lateral thoracic wall. No acute abnormality of the bony thorax is demonstrated.  IMPRESSION: Mildly increased pulmonary interstitial markings may reflect pneumonia or CHF or a combination of both. The possibility of lymphangitic spread of malignancy on the left is raised. Follow-up chest CT scanning is recommended.   Electronically Signed   By: David  [place]   On: 03/22/2014 13:10     EKG Interpretation None     2:49 PM On exam the patient appears slightly more comfortable.  He remains tachycardic, 105, he answers of some questions appropriately, though briefly. The patient will not wear the oxygen MDM    patient with progressive decline in functioning now presents with acute worsening over the past days, after a period of downtime.  Patient is on a urinary tract infection, as well as CT evidence of possible bronchogenic carcinoma. Given the patient's lactic acidosis, urinary tract infection, he received antibiotics, will be admitted for further evaluation, management of his progressive decline, possible lucency, urinary tract infection.       Carmin Muskrat, MD 03/22/14 1450

## 2014-03-22 NOTE — ED Notes (Signed)
Hospitalist at bedside 

## 2014-03-22 NOTE — ED Notes (Signed)
Bed: WA02 Expected date:  Expected time:  Means of arrival:  Comments: EMS- elderly, found on floor

## 2014-03-22 NOTE — ED Notes (Signed)
MD at bedside. 

## 2014-03-23 DIAGNOSIS — F0390 Unspecified dementia without behavioral disturbance: Secondary | ICD-10-CM

## 2014-03-23 DIAGNOSIS — E43 Unspecified severe protein-calorie malnutrition: Secondary | ICD-10-CM

## 2014-03-23 DIAGNOSIS — Z515 Encounter for palliative care: Secondary | ICD-10-CM

## 2014-03-23 DIAGNOSIS — R4182 Altered mental status, unspecified: Secondary | ICD-10-CM

## 2014-03-23 LAB — COMPREHENSIVE METABOLIC PANEL
ALT: 7 U/L (ref 0–53)
AST: 18 U/L (ref 0–37)
Albumin: 1.6 g/dL — ABNORMAL LOW (ref 3.5–5.2)
Alkaline Phosphatase: 57 U/L (ref 39–117)
Anion gap: 7 (ref 5–15)
BILIRUBIN TOTAL: 1 mg/dL (ref 0.3–1.2)
BUN: 27 mg/dL — ABNORMAL HIGH (ref 6–23)
CHLORIDE: 110 meq/L (ref 96–112)
CO2: 23 meq/L (ref 19–32)
CREATININE: 1.21 mg/dL (ref 0.50–1.35)
Calcium: 7.6 mg/dL — ABNORMAL LOW (ref 8.4–10.5)
GFR calc Af Amer: 63 mL/min — ABNORMAL LOW (ref >90)
GFR, EST NON AFRICAN AMERICAN: 54 mL/min — AB (ref >90)
Glucose, Bld: 93 mg/dL (ref 70–99)
Potassium: 3.8 mEq/L (ref 3.7–5.3)
Sodium: 140 mEq/L (ref 137–147)
Total Protein: 6.7 g/dL (ref 6.0–8.3)

## 2014-03-23 LAB — CBC
HCT: 25.1 % — ABNORMAL LOW (ref 39.0–52.0)
Hemoglobin: 8.5 g/dL — ABNORMAL LOW (ref 13.0–17.0)
MCH: 34.1 pg — AB (ref 26.0–34.0)
MCHC: 33.9 g/dL (ref 30.0–36.0)
MCV: 100.8 fL — AB (ref 78.0–100.0)
PLATELETS: 94 10*3/uL — AB (ref 150–400)
RBC: 2.49 MIL/uL — ABNORMAL LOW (ref 4.22–5.81)
RDW: 17.7 % — AB (ref 11.5–15.5)
WBC: 9 10*3/uL (ref 4.0–10.5)

## 2014-03-23 MED ORDER — CIPROFLOXACIN HCL 250 MG PO TABS
250.0000 mg | ORAL_TABLET | Freq: Two times a day (BID) | ORAL | Status: DC
  Administered 2014-03-23 – 2014-03-25 (×3): 250 mg via ORAL
  Filled 2014-03-23 (×8): qty 1

## 2014-03-23 NOTE — Progress Notes (Signed)
Full note to follow:  I met today with [name] daughter, [name], at bedside. [name] is mostly asleep but occasionally turns head and speaks (unable to understand speech). [name] tells me that she wishes for her father to be comfortable and cared for. She has been pleased with [name] independent living and knows he needs higher level of care but she feared the effects of his dementia if she were to move him so she hired caregivers to be with him. She tells me about all they have gone through with his heart problems, she helped him detox from alcohol, and his lobectomy for his cancer in 2006. She visits him daily and works part time to be available for him. We completed MOST: DNR, comfort measures (does not wish for rehospitalization unless condition is thought to be reversible), consider risks/benefits of antibiotics, consider defined trial of IV fluids, no feeding tube. She does wish to treat the treatable and reversible.   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 He was very social but has become withdrawn over the past few months. He has also been weakened - to the point where [name] could no longer take him out to dinner as she did previously (5x/week prior to going to [place]).   [name], NP Palliative Medicine Team Pager # [phone] (M-F 8a-5p) Team Phone # [phone] (Nights/Weekends)

## 2014-03-23 NOTE — Progress Notes (Signed)
Patient refuses IV restart at this time. He agrees to Biochemist, clinical try later today.

## 2014-03-23 NOTE — Progress Notes (Signed)
Pt bladder scanned this am for 372 ml. Dr. Baltazar Najjar made aware ordered to catherize prn for inability to void. Pt in and out cath for 400 ml of dk amber urine.

## 2014-03-23 NOTE — Progress Notes (Signed)
Clinical Social Work  CSW met with patient and dtr at bedside. Dtr currently meeting with PMT NP. CSW provided dtr with contact information and explained role. Dtr agreeable to follow up with PMT and await PT recommendations prior to making any final decisions.  CSW will continue to follow.  [place], [place] [phone]

## 2014-03-23 NOTE — Progress Notes (Signed)
Clinical Social Work  CSW following for [place]. Per chart review, family is interested in a higher level of care. CSW spoke with attending MD re: ordering PT/OT to determine patient's level of care that is needed. CSW will continue to follow.  [place], [place] [phone]

## 2014-03-23 NOTE — Progress Notes (Signed)
PT Cancellation Note  Patient Details Name: ESHAAN TITZER MRN: [id number] DOB: [birth year]   Cancelled Treatment:    Reason Eval/Treat Not Completed:  (Currently having Palliative Consult. will check on tomorrow for Edie decision. )   Claretha Cooper 03/23/2014, 2:29 PM

## 2014-03-23 NOTE — Consult Note (Signed)
Patient JQ:[id number] [name]      DOB: October 04, 1930      RAQ:[id number]     Consult Note from the Palliative Medicine Team at [place] Requested by: Dr. Maryland Pink     PCP: Adella Hare, MD Reason for Consultation: [place] and options    Phone Number:None  Assessment of patients Current state: I met today with [name] daughter, [name], at bedside. [name] is mostly asleep but occasionally turns head and speaks (unable to understand speech). [name] tells me that she wishes for her father to be comfortable and cared for. She has been pleased with [name] and knows he needs higher level of care but she feared the effects of his dementia if she were to move him so she hired caregivers to be with him. She tells me about all they have gone through with his heart problems, she helped him detox from alcohol, and his lobectomy for his cancer in 2006. She visits him daily and works part time to be available for him. We completed MOST: DNR, comfort measures (does not wish for rehospitalization unless condition is thought to be reversible), consider risks/benefits of antibiotics, consider defined trial of IV fluids, no feeding tube. She does wish to treat the treatable and reversible.   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 He was very social but has become withdrawn over the past few months. He has also been weakened - to the point where [name] could no longer take him out to dinner as she did previously (5x/week prior to going to [place]). Awaiting PT for disposition recommendations - palliative to follow at discharge.    Goals of Care: 1.  Code Status: DNR   2. Scope of Treatment: [name] wants comfort for her  father. She wants to get him into a facility where he can get the care he needs and deserves. She does wish to treat the treatable and reversible and she has done much research on dementia and understands the progression. She also understands the benefit of hospice care and when this may be appropriate.   4. Disposition: To be determined on outcomes and recommendations. Assisted living vs SNF.    3. Symptom Management:   1. Pain: Oxycodone 5 mg every 4 hours prn moderate pain prn. Morphine 1 mg every 2 hours prn severe pain.  2. Bowel Regimen: Colace BID.  3. Fever: Acetaminophen prn.  4. Nausea/Vomiting: Ondansetron prn.  5. Severe Malnutrition: Continue medical management. Continue SLP and dysphagia 3 diet. Ensure BID - daughter has been providing these to him prior to admission.   4. Psychosocial: Emotional support provided to patient and daughter.    Patient Documents Completed or Given: Document Given Completed  Advanced Directives Pkt    MOST  yes  DNR    Gone from [facility]    Hard Choices      Brief HPI: 80 yo admitted with worsening confusion and being treated for UTI and respiratory failure (appears secondary to metastatic bronchogenic carcinoma). PMH significant for anemia, dementia, alcohol abuse, atrial fibrillation, s/p CABG, squamous cell lung cancer (s/p surgical resection 2006 required no further treatment). He has progressive dementia and has been more withdrawn the past few months with a  great decrease in his ability to perform ADLs.    ROS: Denies pain, anxiety, sleep disturbance.     PMH:  Past Medical History  Diagnosis Date  . Coronary atherosclerosis   . Pure hypercholesterolemia   . Unspecified essential hypertension   . Shortness of breath   . Other diseases of lung, not elsewhere classified   . Influenza with other manifestations   . Other general symptoms(780.99)   . Urinary tract infection, site not specified   . Chills (without fever)   .  Squamous cell lung cancer      PSH: Past Surgical History  Procedure Laterality Date  . Coronary artery bypass graft  2007  . Thoracotomy  2006    right upper lobe  . Appendectomy  1945  . Cholecystectomy  1975  . Tonsillectomy  1942  . Hemorrhoid surgery  1975   I have reviewed the FH and SH and  If appropriate update it with new information. Allergies  Allergen Reactions  . Cephalexin     unknown   Scheduled Meds: . ciprofloxacin  250 mg Oral BID  . docusate sodium  100 mg Oral BID  . enoxaparin (LOVENOX) injection  40 mg Subcutaneous Q24H  . metoprolol tartrate  12.5 mg Oral BID  . thiamine IV  100 mg Intravenous Daily   Continuous Infusions:  PRN Meds:.acetaminophen, acetaminophen, albuterol, morphine injection, ondansetron (ZOFRAN) IV, ondansetron, oxyCODONE    BP 109/79  Pulse 86  Temp(Src) 97.1 F (36.2 C) (Oral)  Resp 16  Ht $R'5\' 6"'SD$  (1.676 m)  Wt 63.5 kg (139 lb 15.9 oz)  BMI 22.61 kg/m2  SpO2 96%   PPS: 40%   Intake/Output Summary (Last 24 hours) at 03/23/14 1618 Last data filed at 03/23/14 0742  Gross per 24 hour  Intake   1225 ml  Output    500 ml  Net    725 ml   LBM: 03/23/14                        Physical Exam:  General:  NAD, thin, pleasant affect HEENT: [place]/AT, mild temporal muscle wasting, moist mucous membranes  Chest: No labored breathing, symmetric CVS: RRR, no JVD Abdomen: Soft, NT, ND Ext: MAE, no edema, warm to touch Neuro: Awake, alert, smiles, oriented to person only  Labs: CBC    Component Value Date/Time   WBC 9.0 03/23/2014 0500   RBC 2.49* 03/23/2014 0500   HGB 8.5* 03/23/2014 0500   HCT 25.1* 03/23/2014 0500   PLT 94* 03/23/2014 0500   MCV 100.8* 03/23/2014 0500   MCH 34.1* 03/23/2014 0500   MCHC 33.9 03/23/2014 0500   RDW 17.7* 03/23/2014 0500   LYMPHSABS 1.1 03/22/2014 1317   MONOABS 1.0 03/22/2014 1317   EOSABS 0.5 03/22/2014 1317   BASOSABS 0.1 03/22/2014 1317    BMET    Component Value Date/Time   NA 140  03/23/2014 0500   K 3.8 03/23/2014 0500   CL 110 03/23/2014 0500   CO2 23 03/23/2014 0500   GLUCOSE 93 03/23/2014 0500   BUN 27* 03/23/2014 0500   CREATININE 1.21 03/23/2014 0500   CALCIUM 7.6* 03/23/2014 0500   GFRNONAA 54* 03/23/2014 0500   GFRAA 63* 03/23/2014 0500    CMP     Component Value Date/Time   NA 140 03/23/2014 0500   K 3.8 03/23/2014 0500   CL 110 03/23/2014 0500   CO2 23 03/23/2014 0500   GLUCOSE 93 03/23/2014 0500  BUN 27* 03/23/2014 0500   CREATININE 1.21 03/23/2014 0500   CALCIUM 7.6* 03/23/2014 0500   PROT 6.7 03/23/2014 0500   ALBUMIN 1.6* 03/23/2014 0500   AST 18 03/23/2014 0500   ALT 7 03/23/2014 0500   ALKPHOS 57 03/23/2014 0500   BILITOT 1.0 03/23/2014 0500   GFRNONAA 54* 03/23/2014 0500   GFRAA 63* 03/23/2014 0500     Time In Time Out Total Time Spent with Patient Total Overall Time  1400 1305 17mn 656m    Greater than 50%  of this time was spent counseling and coordinating care related to the above assessment and plan.  AlVinie SillNP Palliative Medicine Team Pager # [phone]-F 8a-5p) Team Phone # [phone]/Weekends)

## 2014-03-23 NOTE — Progress Notes (Signed)
Second attempt made to restart IV. Pt continues to refuse IV restart. IV cipro given PO.

## 2014-03-23 NOTE — Progress Notes (Signed)
Patient ID: [name], male   DOB: Dec 05, 1930, 80 y.o.   MRN: [id number]  [facility] PROGRESS NOTE  [name] EXB:[id number] DOB: 01-10-1931 DOA: 03/22/2014 PCP: Adella Hare, MD  Brief narrative: 80 y.o. male with coronary artery disease, status post CABG, history of lung cancer in 2006 status post surgical resection (no chemotherapy), dementia, due to alcoholism, paroxysmal atrial fibrillation, who was brought into the emergency department by his daughter. Patient was diagnosed with dementia about 2-1/2 years ago. During that time frame patient has been declining. Patient still lives in an independent retirement community. However, in the last 2-3 months he has been unable to do any of his usual activities. Unable to do his ADLs. He has not been able to go down to eat his meals. His daughter has been bringing him his meals. She has noticed that his confusion has worsened.  Assessment and Plan:    Principal Problem:   UTI (lower urinary tract infection) - pt is clinically stable - will continue Ciprofloxacin but will transition to PO as pt is able to eat breakfast - WBC trending down and WNL this AM - will need to follow up on urine culture  Active Problems:   Acute hypoxic respiratory failure - appears to be secondary to metastatic bronchogenic carcinoma  - daughter and pt do not want to proceed with any aggressive therapy, wants comfort - PCT consulted    Acute on chronic blood loss anemia - some dilutional component - no active bleeding - repeat CBC in AM   Thrombocytopenia  - slight drop since admission - no signs of active bleeding  - CBC in AM   Dementia associated with alcoholism - continue Thiamine  - appears stable this AM   PAF (paroxysmal atrial fibrillation) - rate controlled, continue Metoprolol    S/P CABG (coronary artery bypass graft)   Severe protein-calorie malnutrition - secondary to progressive nature of the illness - comfort feeding  as pt able to tolerate   DVT prophylaxis  Lovenox SQ while pt is in hospital  Code Status: DNR Family Communication: Pt at bedside Disposition Plan: remains inpatient    IV Access:   Peripheral IV Procedures and diagnostic studies:   Ct Head Wo Contrast  03/22/2014  There is no acute intracranial hemorrhage nor evidence of acute ischemic change. Progressive moderate diffuse cerebral and cerebellar atrophy. There is no acute skull fracture.    Ct Chest Wo Contrast  03/22/2014  Findings are worrisome for metastatic bronchogenic carcinoma. There is a left upper lobe mass with suspected overlying rib destruction. Other lung masses are noted. Adenopathy in the neck, chest, and abdomen is present. Bilateral adrenal masses are noted. Repeat CT of the chest and abdomen with contrast is recommended.  CXR  03/22/2014  Mildly increased pulmonary interstitial markings may reflect pneumonia or CHF or a combination of both. The possibility of lymphangitic spread of malignancy on the left is raised. Follow-up chest CT scanning is recommended.    Medical Consultants:   None  Other Consultants:   Physical therapy  Anti-Infectives:   Ciprofloxacin 9/15 -->  Faye Ramsay, MD  [facility] Pager [phone]  If 7PM-7AM, please contact night-coverage [email or web address] Password [email or web address] 03/23/2014, 4:31 PM   LOS: 1 day   HPI/Subjective: No events overnight.   Objective: Filed Vitals:   03/23/14 0541 03/23/14 0611 03/23/14 0612 03/23/14 1344  BP:  81/37 82/44 109/79  Pulse: 70   86  Temp: 98 F (36.7 C)   97.1 F (  36.2 C)  TempSrc: Oral   Oral  Resp: 20   16  Height:      Weight: 63.5 kg (139 lb 15.9 oz)     SpO2: 93%   96%    Intake/Output Summary (Last 24 hours) at 03/23/14 1631 Last data filed at 03/23/14 0742  Gross per 24 hour  Intake   1225 ml  Output    500 ml  Net    725 ml    Exam:   General:  Pt is alert, follows commands appropriately, not in acute distress  Cardiovascular:  Regular rate and rhythm, no rubs, no gallops  Respiratory: Scattered rhonchi and diminished breath sounds at base   Abdomen: Soft, non tender, non distended, bowel sounds present, no guarding  Extremities: No edema, pulses DP and PT palpable bilaterally  Data Reviewed: Basic Metabolic Panel:  Recent Labs Lab 03/22/14 1317 03/23/14 0500  NA 142 140  K 4.0 3.8  CL 109 110  CO2 23 23  GLUCOSE 88 93  BUN 28* 27*  CREATININE 1.16 1.21  CALCIUM 8.1* 7.6*   Liver Function Tests:  Recent Labs Lab 03/22/14 1317 03/23/14 0500  AST 27 18  ALT 9 7  ALKPHOS 90 57  BILITOT 1.3* 1.0  PROT 8.1 6.7  ALBUMIN 2.0* 1.6*   CBC:  Recent Labs Lab 03/22/14 1317 03/23/14 0500  WBC 12.5* 9.0  NEUTROABS 9.9*  --   HGB 10.7* 8.5*  HCT 31.0* 25.1*  MCV 102.3* 100.8*  PLT 113* 94*   Scheduled Meds: . ciprofloxacin  250 mg Oral BID  . docusate sodium  100 mg Oral BID  . enoxaparin (LOVENOX) injection  40 mg Subcutaneous Q24H  . metoprolol tartrate  12.5 mg Oral BID  . thiamine IV  100 mg Intravenous Daily   Continuous Infusions:

## 2014-03-24 DIAGNOSIS — R0602 Shortness of breath: Secondary | ICD-10-CM

## 2014-03-24 LAB — CBC
HCT: 26.6 % — ABNORMAL LOW (ref 39.0–52.0)
Hemoglobin: 9.3 g/dL — ABNORMAL LOW (ref 13.0–17.0)
MCH: 34.1 pg — ABNORMAL HIGH (ref 26.0–34.0)
MCHC: 35 g/dL (ref 30.0–36.0)
MCV: 97.4 fL (ref 78.0–100.0)
Platelets: 94 10*3/uL — ABNORMAL LOW (ref 150–400)
RBC: 2.73 MIL/uL — ABNORMAL LOW (ref 4.22–5.81)
RDW: 17.4 % — AB (ref 11.5–15.5)
WBC: 9.1 10*3/uL (ref 4.0–10.5)

## 2014-03-24 LAB — BASIC METABOLIC PANEL
Anion gap: 9 (ref 5–15)
BUN: 25 mg/dL — ABNORMAL HIGH (ref 6–23)
CO2: 21 mEq/L (ref 19–32)
Calcium: 7.6 mg/dL — ABNORMAL LOW (ref 8.4–10.5)
Chloride: 106 mEq/L (ref 96–112)
Creatinine, Ser: 1.02 mg/dL (ref 0.50–1.35)
GFR, EST AFRICAN AMERICAN: 77 mL/min — AB (ref >90)
GFR, EST NON AFRICAN AMERICAN: 67 mL/min — AB (ref >90)
Glucose, Bld: 93 mg/dL (ref 70–99)
POTASSIUM: 3.9 meq/L (ref 3.7–5.3)
SODIUM: 136 meq/L — AB (ref 137–147)

## 2014-03-24 MED ORDER — VITAMIN B-1 100 MG PO TABS
100.0000 mg | ORAL_TABLET | Freq: Every day | ORAL | Status: DC
  Administered 2014-03-24 – 2014-03-25 (×2): 100 mg via ORAL
  Filled 2014-03-24 (×2): qty 1

## 2014-03-24 MED ORDER — ENSURE COMPLETE PO LIQD
237.0000 mL | Freq: Two times a day (BID) | ORAL | Status: DC
  Administered 2014-03-24 – 2014-03-25 (×4): 237 mL via ORAL

## 2014-03-24 NOTE — Clinical Documentation Improvement (Signed)
MD's,  NP's, and PA's  Patient being treated for UTI, with tachycardia,  resp (21-24), and slightly elevated WBC . If either of the following clinical conditions are appropriate please document in chart. Thank you     Possible Clinical Conditions?  SIRS with UTI  Sepsis with UTI  Other Condition   Cannot clinically Determine   Thank You, Ree Kida ,RN Clinical Documentation Specialist:  [place] Information Management

## 2014-03-24 NOTE — Progress Notes (Signed)
Clinical Social Work  PT has evaluated patient and is recommending SNF placement. CSW spoke with dtr who is agreeable to SNF and feels patient needs extra assistance at DC. CSW explained SNF process and left list in room for dtr to review. CSW faxed FL2 to [facility] and will follow up with bed offers.  [place], [place] [phone]

## 2014-03-24 NOTE — Progress Notes (Addendum)
BRIEF PHARMACY NOTE This patient is receiving Folic acid 100mg  IV daily. Based on criteria approved by the Pharmacy and Therapeutics Committee, this medication is being converted to the equivalent oral dose form. These criteria include:   . The patient is eating (either orally or per tube) and/or has been taking other orally administered medications for at least 24 hours.  . This patient has no evidence of active gastrointestinal bleeding or impaired GI absorption (gastrectomy, short bowel, patient on TNA or NPO).   If you have questions about this conversion, please contact the pharmacy department.  Kizzie Furnish, PharmD Pager: [phone] 03/24/2014 8:32 AM

## 2014-03-24 NOTE — Progress Notes (Signed)
Pt bladder scanned: 358 mL. Pt encouraged to void without success. In and out catheterization was performed resulting in 250 mL of amber urine. Will continue to monitor.

## 2014-03-24 NOTE — Progress Notes (Signed)
Progress Note from the Palliative Medicine Team at [place]: [name] has just finished with PT. He is back in bed and continues to be pleasant. He has no complaints. He has difficulty expressing his thoughts. I spoke with his daughter, [name], over the phone. She says she has spoken with CSW and she is prepared for him to go to SNF. She confirms as we discussed yesterday the plan for palliative to follow as bridge to hospice. She also understands he will likely d/c in the next 1-2 days and agrees with this plan. I will continue to follow and support.    Objective: Allergies  Allergen Reactions  . Cephalexin     unknown   Scheduled Meds: . ciprofloxacin  250 mg Oral BID  . docusate sodium  100 mg Oral BID  . enoxaparin (LOVENOX) injection  40 mg Subcutaneous Q24H  . feeding supplement (ENSURE COMPLETE)  237 mL Oral BID BM  . metoprolol tartrate  12.5 mg Oral BID  . thiamine  100 mg Oral Daily   Continuous Infusions:  PRN Meds:.acetaminophen, acetaminophen, albuterol, morphine injection, ondansetron (ZOFRAN) IV, ondansetron, oxyCODONE  BP 101/44  Pulse 107  Temp(Src) 97.6 F (36.4 C) (Oral)  Resp 34  Ht 5\' 6"  (1.676 m)  Wt 63.5 kg (139 lb 15.9 oz)  BMI 22.61 kg/m2  SpO2 92%   PPS: 30%     Intake/Output Summary (Last 24 hours) at 03/24/14 1541 Last data filed at 03/24/14 1409  Gross per 24 hour  Intake    480 ml  Output    250 ml  Net    230 ml      LBM: 03/23/14  Physical Exam:  General: NAD, thin, pleasant affect  HEENT: [place]/AT, mild temporal muscle wasting, moist mucous membranes  Chest: No labored breathing, symmetric  CVS: RRR, no JVD  Abdomen: Soft, NT, ND  Ext: MAE, no edema, warm to touch  Neuro: Awake, alert, smiles, oriented to person only  Labs: CBC    Component Value Date/Time   WBC 9.1 03/24/2014 0025   RBC 2.73* 03/24/2014 0025   HGB 9.3* 03/24/2014 0025   HCT 26.6* 03/24/2014 0025   PLT 94* 03/24/2014 0025   MCV 97.4  03/24/2014 0025   MCH 34.1* 03/24/2014 0025   MCHC 35.0 03/24/2014 0025   RDW 17.4* 03/24/2014 0025   LYMPHSABS 1.1 03/22/2014 1317   MONOABS 1.0 03/22/2014 1317   EOSABS 0.5 03/22/2014 1317   BASOSABS 0.1 03/22/2014 1317    BMET    Component Value Date/Time   NA 136* 03/24/2014 0025   K 3.9 03/24/2014 0025   CL 106 03/24/2014 0025   CO2 21 03/24/2014 0025   GLUCOSE 93 03/24/2014 0025   BUN 25* 03/24/2014 0025   CREATININE 1.02 03/24/2014 0025   CALCIUM 7.6* 03/24/2014 0025   GFRNONAA 67* 03/24/2014 0025   GFRAA 77* 03/24/2014 0025    CMP     Component Value Date/Time   NA 136* 03/24/2014 0025   K 3.9 03/24/2014 0025   CL 106 03/24/2014 0025   CO2 21 03/24/2014 0025   GLUCOSE 93 03/24/2014 0025   BUN 25* 03/24/2014 0025   CREATININE 1.02 03/24/2014 0025   CALCIUM 7.6* 03/24/2014 0025   PROT 6.7 03/23/2014 0500   ALBUMIN 1.6* 03/23/2014 0500   AST 18 03/23/2014 0500   ALT 7 03/23/2014 0500   ALKPHOS 57 03/23/2014 0500   BILITOT 1.0 03/23/2014 0500   GFRNONAA 67* 03/24/2014 0025  GFRAA 77* 03/24/2014 0025    Assessment and Plan: 1. Code Status: DNR 2. Symptom Control:  1. Pain: Oxycodone 5 mg every 4 hours prn moderate pain prn. Morphine 1 mg every 2 hours prn severe pain.  2. May require roxanol low-dose 5 mg prn at SNF for any dyspnea.  3. Bowel Regimen: Colace BID.  4. Fever: Acetaminophen prn.  5. Nausea/Vomiting: Ondansetron prn.  6. Severe Malnutrition: Continue medical management. Continue SLP and dysphagia 3 diet. Ensure BID - daughter has been providing these to him prior to admission.  3. Psycho/Social: Emotional support provided to patient and family via telephone.  4. Disposition: SNF with palliative to follow.    Time In Time Out Total Time Spent with Patient Total Overall Time  1525 1545 72min 18min    Greater than 50%  of this time was spent counseling and coordinating care related to the above assessment and plan.  [name], NP Palliative Medicine Team Pager #  [phone] (M-F 8a-5p) Team Phone # [phone] (Nights/Weekends)

## 2014-03-24 NOTE — Progress Notes (Signed)
PT Cancellation Note  Patient Details Name: [name] MRN: [id number] DOB: 1931-02-26   Cancelled Treatment:    Reason Eval/Treat Not Completed: PT screened, no needs identified, will sign off; Noted that it appears GOC are comfort per PC team, pt sleeping most of time per notes;  Re-order if status changes/needs arise; Thank you.   Kenyon Ana, PT Pager: [phone] 03/24/2014    Kenyon Ana 03/24/2014, 10:31 AM

## 2014-03-24 NOTE — Progress Notes (Addendum)
Clinical Social Work Department CLINICAL SOCIAL WORK PLACEMENT NOTE 03/24/2014  Patient:  [name], [name]  Account Number:  [id number] Admit date:  03/22/2014  Clinical Social Worker:  Sindy Messing, LCSW  Date/time:  03/24/2014 03:30 PM  Clinical Social Work is seeking post-discharge placement for this patient at the following level of care:   SKILLED NURSING   (*CSW will update this form in Epic as items are completed)   03/24/2014  Patient/family provided with [place] Department of Clinical Social Work's list of facilities offering this level of care within the geographic area requested by the patient (or if unable, by the patient's family).  03/24/2014  Patient/family informed of their freedom to choose among providers that offer the needed level of care, that participate in Medicare, Medicaid or managed care program needed by the patient, have an available bed and are willing to accept the patient.  03/24/2014  Patient/family informed of [facility]' ownership interest in [facility], as well as of the fact that they are under no obligation to receive care at this facility.  PASARR submitted to EDS on 03/24/2014 PASARR number received on 03/24/2014  FL2 transmitted to all facilities in geographic area requested by pt/family on  03/24/2014 FL2 transmitted to all facilities within larger geographic area on   Patient informed that his/her managed care company has contracts with or will negotiate with  certain facilities, including the following:     Patient/family informed of bed offers received:  03/25/14 Patient chooses bed at [facility] Physician recommends and patient chooses bed at    Patient to be transferred to [facility] on  03/25/14 Patient to be transferred to facility by [facility] Patient and family notified of transfer on 03/25/14 Name of family member notified:  Christina-dtr at bedside  The following physician request were entered  in Epic:   Additional Comments:   Sindy Messing, [place] [phone]

## 2014-03-24 NOTE — Progress Notes (Signed)
Clinical Social Work  CSW spoke with PT who will evaluate patient to determine level of care needed. CSW will follow up after evaluation.   [address], [place] [phone]

## 2014-03-24 NOTE — Evaluation (Signed)
Physical Therapy Evaluation Patient Details Name: [name] MRN: [id number] DOB: 1930-12-17 Today's Date: 03/24/2014   History of Present Illness  [name] is a 80 y.o. male with new metastatic bronchogenic carcinoma in patient with previous history of lung cancer;  Pt with acute encephalopathy in setting of UTI, dehydration; past medical history of coronary artery disease, status post CABG, history of lung cancer in 2006 status post surgical resection (no chemotherapy), dementia, due to alcoholism, paroxysmal atrial fibrillation  Clinical Impression  Pt will benefit from PT to address deficits below; He is limited this date by incr RR while sitting EOB; Discussed with Palliative Care Team and pt is not ready for residential hospice at this point and may be a candidate for short term rehab at [facility] setting. Will follow in acute setting and progress as able    Follow Up Recommendations SNF    Equipment Recommendations  None recommended by PT    Recommendations for Other Services       Precautions / Restrictions Precautions Precautions: Fall Precaution Comments: monitor VS, RR      Mobility  Bed Mobility Overal bed mobility: Needs Assistance Bed Mobility: Supine to Sit;Sit to Supine     Supine to sit: Total assist Sit to supine: Mod assist   General bed mobility comments: pt requires incr time and repetition of cues, needs assist with UB and LEs to come to sit;   Transfers                 General transfer comment: unable to attempt with +1 assist, pt also with  incr RR and deferred further activity, pt also requesting to lie down  Ambulation/Gait                Stairs            Wheelchair Mobility    Modified Rankin (Stroke Patients Only)       Balance Overall balance assessment: Needs assistance Sitting-balance support: Feet supported;Single extremity supported Sitting balance-Leahy Scale: Poor Sitting balance - Comments: sat  EOB x6 min; static, incr RR, up to 32, 20s at rest                                     Pertinent Vitals/Pain Pain Assessment: 0-10 Pain Score: 2  Pain Location: neck Pain Intervention(s): Repositioned    Home Living Family/patient expects to be discharged to:: Unsure Living Arrangements: Alone               Additional Comments: pt unable to give home setting, per chart pt lived in retirement community prior to adm but recent decline    Prior Function Level of Independence: Needs assistance   Gait / Transfers Assistance Needed: unable to amb recently ?last few wks; prior to this pt was community amb  ADL's / Homemaking Assistance Needed: needs assist        Hand Dominance        Extremity/Trunk Assessment   Upper Extremity Assessment: Generalized weakness           Lower Extremity Assessment: Generalized weakness         Communication   Communication: No difficulties  Cognition Arousal/Alertness: Awake/alert Behavior During Therapy: Flat affect Overall Cognitive Status: Impaired/Different from baseline Area of Impairment: Following commands;Problem solving       Following Commands: Follows one step commands inconsistently     Problem  Solving: Slow processing;Decreased initiation;Difficulty sequencing;Requires verbal cues;Requires tactile cues      General Comments      Exercises        Assessment/Plan    PT Assessment Patient needs continued PT services  PT Diagnosis Generalized weakness   PT Problem List Decreased strength;Decreased activity tolerance;Decreased balance;Decreased mobility  PT Treatment Interventions DME instruction;Gait training;Functional mobility training;Therapeutic activities;Therapeutic exercise;Patient/family education   PT Goals (Current goals can be found in the Care Plan section) Acute Rehab PT Goals Patient Stated Goal: unable to state PT Goal Formulation: Patient unable to participate in goal  setting Time For Goal Achievement: 04/07/14 Potential to Achieve Goals: Fair    Frequency Min 3X/week   Barriers to discharge        Co-evaluation               End of Session   Activity Tolerance: Patient limited by fatigue;Treatment limited secondary to medical complications (Comment) Patient left: in bed;with call bell/phone within reach;with bed alarm set           Time: 1450-1506 PT Time Calculation (min): 16 min   Charges:   PT Evaluation $Initial PT Evaluation Tier I: 1 Procedure PT Treatments $Therapeutic Activity: 8-22 mins   PT G Codes:          Rajeev Escue 2014/04/20, 3:14 PM

## 2014-03-24 NOTE — Progress Notes (Signed)
Patient ID: [name], male   DOB: 11-Apr-1931, 80 y.o.   MRN: [id number]  [facility] PROGRESS NOTE  [name] IRW:[id number] DOB: 1931/06/08 DOA: 03/22/2014 PCP: Adella Hare, MD  Brief narrative:  80 y.o. male with coronary artery disease, status post CABG, history of lung cancer in 2006 status post surgical resection (no chemotherapy), dementia, due to alcoholism, paroxysmal atrial fibrillation, who was brought into the emergency department by his daughter. Patient was diagnosed with dementia about 2-1/2 years ago. During that time frame patient has been declining. Patient still lives in an independent retirement community. However, in the last 2-3 months he has been unable to do any of his usual activities. Unable to do his ADLs. He has not been able to go down to eat his meals. His daughter has been bringing him his meals. She has noticed that his confusion has worsened.   Assessment and Plan:   Principal Problem:  SIRS secondary to UTI - pt initially with tachycardia, hypotension and UTI - ABX as noted below, pt is clinically improving  UTI (lower urinary tract infection)  - pt is clinically stable  - will continue Ciprofloxacin, urine culture was not obtained on admission - it was requested 9/17, may be negative as pt already on ABX - WBC trending down and WNL this AM  - will need to follow up on urine culture  Active Problems:  Acute hypoxic respiratory failure  - appears to be secondary to metastatic bronchogenic carcinoma  - daughter and pt do not want to proceed with any aggressive therapy, wants comfort  - PCT consulted  - awaiting PT eval rec's to determine is SNF is reasonable d/c option Acute on chronic blood loss anemia  - some dilutional component  - no active bleeding  - repeat CBC in AM  Thrombocytopenia  - slight drop since admission  - no signs of active bleeding  - CBC in AM  Dementia associated with alcoholism  - continue Thiamine  -  appears stable this AM  PAF (paroxysmal atrial fibrillation)  - rate controlled, continue Metoprolol  S/P CABG (coronary artery bypass graft)  Severe protein-calorie malnutrition  - secondary to progressive nature of the illness  - comfort feeding as pt able to tolerate  Functional quadriplegia - from progressive failure to thrive - awaiting PT eval rec's  DVT prophylaxis  Lovenox SQ while pt is in hospital  Code Status: DNR  Family Communication: Pt at bedside  Disposition Plan: remains inpatient, SNF when PT provides official rec's   IV Access:   Peripheral IV Procedures and diagnostic studies:   Ct Head Wo Contrast 03/22/2014 There is no acute intracranial hemorrhage nor evidence of acute ischemic change. Progressive moderate diffuse cerebral and cerebellar atrophy. There is no acute skull fracture.  Ct Chest Wo Contrast 03/22/2014 Findings are worrisome for metastatic bronchogenic carcinoma. There is a left upper lobe mass with suspected overlying rib destruction. Other lung masses are noted. Adenopathy in the neck, chest, and abdomen is present. Bilateral adrenal masses are noted. Repeat CT of the chest and abdomen with contrast is recommended.  CXR 03/22/2014 Mildly increased pulmonary interstitial markings may reflect pneumonia or CHF or a combination of both. The possibility of lymphangitic spread of malignancy on the left is raised. Follow-up chest CT scanning is recommended.  Medical Consultants:   None  Other Consultants:   Physical therapy  Anti-Infectives:   Ciprofloxacin 9/15 -->   Todd Ramsay, MD  [facility] Pager [phone]  If  7PM-7AM, please contact night-coverage [email or web address] Password [email or web address] 03/24/2014, 2:30 PM   LOS: 2 days   HPI/Subjective: No events overnight.   Objective: Filed Vitals:   03/23/14 1344 03/23/14 2134 03/24/14 0525 03/24/14 1300  BP: 109/79 116/51 105/45 101/44  Pulse: 86 96 92 107  Temp: 97.1 F (36.2 C) 98.1 F (36.7 C) 97.3 F  (36.3 C) 97.6 F (36.4 C)  TempSrc: Oral Oral Oral Oral  Resp: 16 18 18  34  Height:      Weight:      SpO2: 96% 98% 93% 92%    Intake/Output Summary (Last 24 hours) at 03/24/14 1430 Last data filed at 03/24/14 1409  Gross per 24 hour  Intake    480 ml  Output    250 ml  Net    230 ml    Exam:   General:  Pt is alert, follows commands appropriately, not in acute distress  Cardiovascular: Irregular rate and rhythm,  no rubs, no gallops  Respiratory: Clear to auscultation bilaterally, no wheezing, diminished breath sounds at bases   Abdomen: Soft, non tender, non distended, bowel sounds present, no guarding  Extremities: No edema, pulses DP and PT palpable bilaterally  Data Reviewed: Basic Metabolic Panel:  Recent Labs Lab 03/22/14 1317 03/23/14 0500 03/24/14 0025  NA 142 140 136*  K 4.0 3.8 3.9  CL 109 110 106  CO2 23 23 21   GLUCOSE 88 93 93  BUN 28* 27* 25*  CREATININE 1.16 1.21 1.02  CALCIUM 8.1* 7.6* 7.6*   Liver Function Tests:  Recent Labs Lab 03/22/14 1317 03/23/14 0500  AST 27 18  ALT 9 7  ALKPHOS 90 57  BILITOT 1.3* 1.0  PROT 8.1 6.7  ALBUMIN 2.0* 1.6*   CBC:  Recent Labs Lab 03/22/14 1317 03/23/14 0500 03/24/14 0025  WBC 12.5* 9.0 9.1  NEUTROABS 9.9*  --   --   HGB 10.7* 8.5* 9.3*  HCT 31.0* 25.1* 26.6*  MCV 102.3* 100.8* 97.4  PLT 113* 94* 94*   Scheduled Meds: . ciprofloxacin  250 mg Oral BID  . docusate sodium  100 mg Oral BID  . enoxaparin (LOVENOX) injection  40 mg Subcutaneous Q24H  . feeding supplement (ENSURE COMPLETE)  237 mL Oral BID BM  . metoprolol tartrate  12.5 mg Oral BID  . thiamine  100 mg Oral Daily   Continuous Infusions:

## 2014-03-25 LAB — URINE CULTURE
COLONY COUNT: NO GROWTH
Culture: NO GROWTH

## 2014-03-25 MED ORDER — OXYCODONE HCL 5 MG PO TABS: 5.0000 mg | ORAL_TABLET | ORAL | Status: DC | PRN

## 2014-03-25 MED ORDER — DSS 100 MG PO CAPS: 100.0000 mg | ORAL_CAPSULE | Freq: Two times a day (BID) | ORAL | Status: DC

## 2014-03-25 MED ORDER — ALBUTEROL SULFATE (2.5 MG/3ML) 0.083% IN NEBU: 2.5000 mg | INHALATION_SOLUTION | RESPIRATORY_TRACT | Status: AC | PRN

## 2014-03-25 MED ORDER — METOPROLOL TARTRATE 12.5 MG HALF TABLET: 12.5000 mg | ORAL_TABLET | Freq: Two times a day (BID) | ORAL | Status: DC

## 2014-03-25 MED ORDER — CIPROFLOXACIN HCL 250 MG PO TABS: 250.0000 mg | ORAL_TABLET | Freq: Two times a day (BID) | ORAL | Status: DC

## 2014-03-25 NOTE — Discharge Summary (Addendum)
Physician Discharge Summary  [name] YNW:[id number] DOB: 01-Dec-1930 DOA: 03/22/2014  PCP: Adella Hare, MD  Admit date: 03/22/2014 Discharge date: 03/25/2014  Recommendations for Outpatient Follow-up:  1. Pt will need to follow up with PCP in 2-3 weeks post discharge 2. Please obtain BMP to evaluate electrolytes and kidney function 3. Please also check CBC to evaluate Hg and Hct levels 4.  Discharge Diagnoses:  Principal Problem:   UTI (lower urinary tract infection) Active Problems:   CORONARY ARTERY DISEASE   Dementia associated with alcoholism   PAF (paroxysmal atrial fibrillation)   S/P CABG (coronary artery bypass graft)   Advanced dementia   Dehydration   Severe protein-calorie malnutrition   Metastatic lung cancer (metastasis from lung to other site)   Palliative care encounter    Discharge Condition: Stable  Diet recommendation: Heart healthy diet discussed in details   Brief narrative:  80 y.o. male with coronary artery disease, status post CABG, history of lung cancer in 2006 status post surgical resection (no chemotherapy), dementia, due to alcoholism, paroxysmal atrial fibrillation, who was brought into the emergency department by his daughter. Patient was diagnosed with dementia about 2-1/2 years ago. During that time frame patient has been declining. Patient still lives in an independent retirement community. However, in the last 2-3 months he has been unable to do any of his usual activities. Unable to do his ADLs. He has not been able to go down to eat his meals. His daughter has been bringing him his meals. She has noticed that his confusion has worsened.   Assessment and Plan:   Principal Problem:  Sepsis secondary to UTI, present on admission - pt initially with tachycardia, hypotension and UTI, leukocytosis  - ABX as noted below, pt is clinically improving  UTI (lower urinary tract infection)  - pt is clinically stable  - will continue  Ciprofloxacin upon discharge, urine culture was not obtained on admission  - it was requested 9/17, may be negative as pt already on ABX  - WBC trending down and WNL this AM  Active Problems:  Acute hypoxic respiratory failure  - appears to be secondary to metastatic bronchogenic carcinoma  - daughter and pt do not want to proceed with any aggressive therapy, wants comfort  - PCT consulted  - awaiting PT eval rec's to determine is SNF is reasonable d/c option  Acute on chronic blood loss anemia  - some dilutional component  - no active bleeding  Thrombocytopenia  - slight drop since admission  - no signs of active bleeding  Dementia associated with alcoholism  - continue Thiamine  - appears stable this AM  PAF (paroxysmal atrial fibrillation)  - rate controlled, continue Metoprolol  S/P CABG (coronary artery bypass graft)  Severe protein-calorie malnutrition  - secondary to progressive nature of the illness  - comfort feeding as pt able to tolerate  Functional quadriplegia  - from progressive failure to thrive  - d/c SNF  Code Status: DNR  Family Communication: Pt at bedside   IV Access:   Peripheral IV Procedures and diagnostic studies:   Ct Head Wo Contrast 03/22/2014 There is no acute intracranial hemorrhage nor evidence of acute ischemic change. Progressive moderate diffuse cerebral and cerebellar atrophy. There is no acute skull fracture.  Ct Chest Wo Contrast 03/22/2014 Findings are worrisome for metastatic bronchogenic carcinoma. There is a left upper lobe mass with suspected overlying rib destruction. Other lung masses are noted. Adenopathy in the neck, chest, and abdomen is present.  Bilateral adrenal masses are noted. Repeat CT of the chest and abdomen with contrast is recommended.  CXR 03/22/2014 Mildly increased pulmonary interstitial markings may reflect pneumonia or CHF or a combination of both. The possibility of lymphangitic spread of malignancy on the left is raised.  Follow-up chest CT scanning is recommended.  Medical Consultants:   None  Other Consultants:   Physical therapy  Anti-Infectives:   Ciprofloxacin 9/15 --> rge Exam: Filed Vitals:   03/25/14 1008  BP: 103/49  Pulse:   Temp:   Resp:    Filed Vitals:   03/25/14 0700 03/25/14 0759 03/25/14 0900 03/25/14 1008  BP: 93/38 96/39 96/52  103/49  Pulse: 78 96    Temp: 98.3 F (36.8 C) 96.4 F (35.8 C)    TempSrc: Oral Oral    Resp: 18 18    Height:      Weight:      SpO2: 92% 92%      General: Pt is alert, follows commands appropriately, not in acute distress Cardiovascular: Regular rate and rhythm, no rubs, no gallops Respiratory: Clear to auscultation bilaterally, no wheezing, no crackles, no rhonchi Abdominal: Soft, non tender, non distended, bowel sounds +, no guarding  Discharge Instructions  Discharge Instructions   Diet - low sodium heart healthy    Complete by:  As directed      Increase activity slowly    Complete by:  As directed             Medication List         albuterol (2.5 MG/3ML) 0.083% nebulizer solution  Commonly known as:  PROVENTIL  Take 3 mLs (2.5 mg total) by nebulization every 2 (two) hours as needed for wheezing.     ciprofloxacin 250 MG tablet  Commonly known as:  CIPRO  Take 1 tablet (250 mg total) by mouth 2 (two) times daily.     DSS 100 MG Caps  Take 100 mg by mouth 2 (two) times daily.        oxyCODONE 5 MG immediate release tablet  Commonly known as:  Oxy IR/ROXICODONE  Take 1 tablet (5 mg total) by mouth every 4 (four) hours as needed for moderate pain.           Follow-up Information   Schedule an appointment as soon as possible for a visit with Adella Hare, MD.   Specialty:  Internal Medicine      Follow up with Faye Ramsay, MD. (As needed, If symptoms worsen)    Specialty:  Internal Medicine   Contact information:   [address] [place] [address] [phone]        The  results of significant diagnostics from this hospitalization (including imaging, microbiology, ancillary and laboratory) are listed below for reference.     Microbiology: Recent Results (from the past 240 hour(s))  URINE CULTURE     Status: None   Collection Time    03/24/14  1:53 AM      Result Value Ref Range Status   Specimen Description URINE, CATHETERIZED   Final   Special Requests NONE   Final   Culture  Setup Time     Final   Value: 03/24/2014 09:15     Performed at [facility]     Final   Value: NO GROWTH     Performed at [facility]   Culture     Final   Value: NO GROWTH  Performed at [facility]   Report Status 03/25/2014 FINAL   Final     Labs: Basic Metabolic Panel:  Recent Labs Lab 03/22/14 1317 03/23/14 0500 03/24/14 0025  NA 142 140 136*  K 4.0 3.8 3.9  CL 109 110 106  CO2 23 23 21   GLUCOSE 88 93 93  BUN 28* 27* 25*  CREATININE 1.16 1.21 1.02  CALCIUM 8.1* 7.6* 7.6*   Liver Function Tests:  Recent Labs Lab 03/22/14 1317 03/23/14 0500  AST 27 18  ALT 9 7  ALKPHOS 90 57  BILITOT 1.3* 1.0  PROT 8.1 6.7  ALBUMIN 2.0* 1.6*   CBC:  Recent Labs Lab 03/22/14 1317 03/23/14 0500 03/24/14 0025  WBC 12.5* 9.0 9.1  NEUTROABS 9.9*  --   --   HGB 10.7* 8.5* 9.3*  HCT 31.0* 25.1* 26.6*  MCV 102.3* 100.8* 97.4  PLT 113* 94* 94*    SIGNED: Time coordinating discharge: Over 30 minutes  Faye Ramsay, MD  [facility] 03/25/2014, 11:16 AM Pager [phone]  If 7PM-7AM, please contact night-coverage [email or web address] Password [email or web address]

## 2014-03-25 NOTE — Progress Notes (Signed)
Spoke briefly with daughter of pt and had longer conversation with daughter's best friend. Daughter has good support in friend. Friend went through similar process with her own father a little over a year ago. Both acknowledge the difficulty of what they're going through but seem to be coping well with each others' support.  Vanetta Mulders 03/25/2014 2:16 PM

## 2014-03-25 NOTE — Progress Notes (Signed)
Clinical Social Work  Per progression meeting, MD reports patient can DC today. CSW spoke with dtr who has bed offers and will go and tour facilities. Dtr will call CSW back with her decision and is aware that patient will transfer to SNF today. CSW will continue to follow.  [place], [place] [phone]

## 2014-03-25 NOTE — Progress Notes (Signed)
Bladder scan performed >233ml noted. Patient states he feels the need to urinate. Will in and out cath per MD order.

## 2014-03-25 NOTE — Progress Notes (Signed)
CARE MANAGEMENT NOTE 03/25/2014  Patient:  [name], [name]   Account Number:  [id number]  Date Initiated:  03/25/2014  Documentation initiated by:  [facility]  Subjective/Objective Assessment:   80 year old male admitted with acute encephalopathy, UTI and dehydration.     Action/Plan:   SNF at d/c   Anticipated DC Date:  April 03, 2014   Anticipated DC Plan:  SKILLED NURSING FACILITY  In-house referral  Clinical Social Worker      DC Planning Services  CM consult      Choice offered to / List presented to:             Status of service:  Completed, signed off Medicare Important Message given?  YES (If response is "NO", the following Medicare IM given date fields will be blank) Date Medicare IM given:  03/25/2014 Medicare IM given by:  [facility] Date Additional Medicare IM given:   Additional Medicare IM given by:    Discharge Disposition:  [place]  Per UR Regulation:  Reviewed for med. necessity/level of care/duration of stay

## 2014-03-25 NOTE — Consult Note (Signed)
I have reviewed this case with our NP and agree with the Assessment and Plan as stated.  Navia Lindahl L. Myrtie Leuthold, MD MBA The Palliative Medicine Team at [place] Team Phone: [phone] Pager: [phone]   

## 2014-03-25 NOTE — Progress Notes (Signed)
Clinical Social Work  Dtr chose a bed at [facility]. CSW faxed updated DC summary to SNF who is agreeable to accept. DC packet prepared with DNR, MOST, hard scripts, FL2 and DC summary included. CSW informed patient and dtr of DC plans and all parties agreeable. RN has already called report to SNF. Dtr requests PTAR to transport and is aware of no guarantee of payment. PTAR #: [id number].  CSW is signing off but available if needed.  [place],  [phone]

## 2014-03-25 NOTE — Discharge Instructions (Signed)

## 2014-03-28 ENCOUNTER — Encounter (HOSPITAL_COMMUNITY): Payer: Self-pay | Admitting: Emergency Medicine

## 2014-03-28 ENCOUNTER — Inpatient Hospital Stay (HOSPITAL_COMMUNITY)
Admission: EM | Admit: 2014-03-28 | Discharge: 2014-04-07 | DRG: 180 | Disposition: E | Payer: Medicare Other | Attending: Internal Medicine | Admitting: Internal Medicine

## 2014-03-28 ENCOUNTER — Other Ambulatory Visit: Payer: Self-pay | Admitting: *Deleted

## 2014-03-28 DIAGNOSIS — F03C0 Unspecified dementia, severe, without behavioral disturbance, psychotic disturbance, mood disturbance, and anxiety: Secondary | ICD-10-CM

## 2014-03-28 DIAGNOSIS — I2510 Atherosclerotic heart disease of native coronary artery without angina pectoris: Secondary | ICD-10-CM | POA: Diagnosis present

## 2014-03-28 DIAGNOSIS — F411 Generalized anxiety disorder: Secondary | ICD-10-CM | POA: Diagnosis present

## 2014-03-28 DIAGNOSIS — R627 Adult failure to thrive: Secondary | ICD-10-CM | POA: Diagnosis present

## 2014-03-28 DIAGNOSIS — I4891 Unspecified atrial fibrillation: Secondary | ICD-10-CM | POA: Diagnosis present

## 2014-03-28 DIAGNOSIS — Z66 Do not resuscitate: Secondary | ICD-10-CM | POA: Diagnosis present

## 2014-03-28 DIAGNOSIS — Z515 Encounter for palliative care: Secondary | ICD-10-CM | POA: Diagnosis not present

## 2014-03-28 DIAGNOSIS — Z820 Family history of epilepsy and other diseases of the nervous system: Secondary | ICD-10-CM | POA: Diagnosis not present

## 2014-03-28 DIAGNOSIS — J4489 Other specified chronic obstructive pulmonary disease: Secondary | ICD-10-CM | POA: Diagnosis present

## 2014-03-28 DIAGNOSIS — D62 Acute posthemorrhagic anemia: Secondary | ICD-10-CM | POA: Diagnosis present

## 2014-03-28 DIAGNOSIS — D649 Anemia, unspecified: Secondary | ICD-10-CM | POA: Diagnosis not present

## 2014-03-28 DIAGNOSIS — C3490 Malignant neoplasm of unspecified part of unspecified bronchus or lung: Secondary | ICD-10-CM | POA: Diagnosis present

## 2014-03-28 DIAGNOSIS — Z87891 Personal history of nicotine dependence: Secondary | ICD-10-CM | POA: Diagnosis not present

## 2014-03-28 DIAGNOSIS — I10 Essential (primary) hypertension: Secondary | ICD-10-CM | POA: Diagnosis present

## 2014-03-28 DIAGNOSIS — Z902 Acquired absence of lung [part of]: Secondary | ICD-10-CM

## 2014-03-28 DIAGNOSIS — F1011 Alcohol abuse, in remission: Secondary | ICD-10-CM | POA: Diagnosis present

## 2014-03-28 DIAGNOSIS — D696 Thrombocytopenia, unspecified: Secondary | ICD-10-CM | POA: Diagnosis present

## 2014-03-28 DIAGNOSIS — F0390 Unspecified dementia without behavioral disturbance: Secondary | ICD-10-CM

## 2014-03-28 DIAGNOSIS — F1027 Alcohol dependence with alcohol-induced persisting dementia: Secondary | ICD-10-CM | POA: Diagnosis present

## 2014-03-28 DIAGNOSIS — R0902 Hypoxemia: Secondary | ICD-10-CM | POA: Diagnosis present

## 2014-03-28 DIAGNOSIS — IMO0002 Reserved for concepts with insufficient information to code with codable children: Secondary | ICD-10-CM

## 2014-03-28 DIAGNOSIS — C801 Malignant (primary) neoplasm, unspecified: Secondary | ICD-10-CM | POA: Diagnosis present

## 2014-03-28 DIAGNOSIS — Z79899 Other long term (current) drug therapy: Secondary | ICD-10-CM | POA: Diagnosis not present

## 2014-03-28 DIAGNOSIS — E43 Unspecified severe protein-calorie malnutrition: Secondary | ICD-10-CM | POA: Diagnosis present

## 2014-03-28 DIAGNOSIS — G9340 Encephalopathy, unspecified: Secondary | ICD-10-CM | POA: Diagnosis present

## 2014-03-28 DIAGNOSIS — J9600 Acute respiratory failure, unspecified whether with hypoxia or hypercapnia: Secondary | ICD-10-CM | POA: Diagnosis present

## 2014-03-28 DIAGNOSIS — Z951 Presence of aortocoronary bypass graft: Secondary | ICD-10-CM

## 2014-03-28 DIAGNOSIS — J449 Chronic obstructive pulmonary disease, unspecified: Secondary | ICD-10-CM | POA: Diagnosis present

## 2014-03-28 MED ORDER — MORPHINE SULFATE 2 MG/ML IJ SOLN
2.0000 mg | Freq: Once | INTRAMUSCULAR | Status: AC
Start: 2014-03-28 — End: 2014-03-28
  Administered 2014-03-28: 2 mg via INTRAVENOUS
  Filled 2014-03-28: qty 1

## 2014-03-28 MED ORDER — ACETAMINOPHEN 650 MG RE SUPP: 650.0000 mg | Freq: Four times a day (QID) | RECTAL | Status: DC | PRN

## 2014-03-28 MED ORDER — OXYCODONE HCL 5 MG PO TABS: ORAL_TABLET | ORAL | Status: DC

## 2014-03-28 MED ORDER — LORAZEPAM 2 MG/ML IJ SOLN
0.5000 mg | Freq: Once | INTRAMUSCULAR | Status: AC
  Administered 2014-03-28: 0.5 mg via INTRAVENOUS
  Filled 2014-03-28: qty 1

## 2014-03-28 MED ORDER — ONDANSETRON HCL 4 MG/2ML IJ SOLN: 4.0000 mg | Freq: Four times a day (QID) | INTRAMUSCULAR | Status: DC | PRN

## 2014-03-28 MED ORDER — LORAZEPAM 2 MG/ML IJ SOLN: 1.0000 mg | INTRAMUSCULAR | Status: DC | PRN

## 2014-03-28 MED ORDER — ACETAMINOPHEN 325 MG PO TABS
650.0000 mg | ORAL_TABLET | Freq: Four times a day (QID) | ORAL | Status: DC | PRN
Start: 2014-03-28 — End: 2014-03-29

## 2014-03-28 MED ORDER — LORAZEPAM 2 MG/ML IJ SOLN
1.0000 mg | Freq: Once | INTRAMUSCULAR | Status: AC
  Administered 2014-03-28: 1 mg via INTRAVENOUS

## 2014-03-28 MED ORDER — SODIUM CHLORIDE 0.9 % IJ SOLN: 3.0000 mL | Freq: Two times a day (BID) | INTRAMUSCULAR | Status: DC

## 2014-03-28 MED ORDER — ONDANSETRON HCL 4 MG/2ML IJ SOLN
4.0000 mg | Freq: Once | INTRAMUSCULAR | Status: AC
  Administered 2014-03-28: 4 mg via INTRAVENOUS
  Filled 2014-03-28: qty 2

## 2014-03-28 MED ORDER — MORPHINE SULFATE 2 MG/ML IJ SOLN: 2.0000 mg | INTRAMUSCULAR | Status: DC | PRN

## 2014-03-28 MED ORDER — LORAZEPAM 2 MG/ML IJ SOLN
1.0000 mg | Freq: Once | INTRAMUSCULAR | Status: AC
  Administered 2014-03-28: 1 mg via INTRAVENOUS
  Filled 2014-03-28: qty 1

## 2014-03-28 MED ORDER — MORPHINE SULFATE 4 MG/ML IJ SOLN
4.0000 mg | Freq: Once | INTRAMUSCULAR | Status: AC
  Administered 2014-03-28: 4 mg via INTRAVENOUS
  Filled 2014-03-28: qty 1

## 2014-03-28 MED ORDER — ONDANSETRON HCL 4 MG PO TABS: 4.0000 mg | ORAL_TABLET | Freq: Four times a day (QID) | ORAL | Status: DC | PRN

## 2014-04-01 NOTE — H&P (Signed)
History and Physical  [name] ATF:[id number] DOB: 12-Nov-1930 DOA: 04-Apr-2014   PCP: Adella Hare, MD   Chief Complaint: lethargy and low hemoglobin  HPI:  80 y.o. 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  During his last admission, the patient was admitted for increasing confusion and failure to thrive which was partly attributable to the patient's urinary tract infection. Today, the patient had a CT of the chest which was concerning for metastatic bronchogenic carcinoma. There is a left upper lobe mass with suspected overlying rib destruction. Other lung masses are noted. Adenopathy in the neck, chest, and abdomen is present. Bilateral adrenal masses are noted. Palliative medicine was consulted and the patient was made DO NOT RESUSCITATE. He was ultimately discharged to [facility] with the intention of ultimately transitioning the patient to hospice care. The patient was discharged with ciprofloxacin and oxycodone IR for his pain. During routine labs obtained on the morning of April 04, 2014  at his nursing facility, his hemoglobin was noted to be 5.4. Result the patient was brought to the emergency department for further evaluation. The patient is unable to provide any history at this time secondary to his encephalopathy. All this history is obtained from review of the chart speaking with the patient's daughter at the bedside. The patient's daughter noted that the patient was more sleepy this morning, but denies any visible blood loss over the past several days.  In the emergency department, patient was noted to be tachycardic and hypoxemic with oxygen saturation in the 70s. Heart rate was in the 100-110s. The patient's daughter the bedside  expressed a desire for transitioning care to focus of total comfort. She did not want any further blood draws or radiographs at this time. The patient was given Ativan and morphine in the emergency department. He is currently not in any distress. Labs from the patient's nursing facility revealed WBC, 0, hemoglobin 12.4, platelets 86,000, serum creatinine 1.49, proBNP 2482.  Assessment/Plan: Acute Blood Loss Anemia -As the focus of care has been transitioned to a focus of total comfort, no further workup will be pursued at this time Acute hypoxic respiratory failure  --As the focus of care has been transitioned to a focus of total comfort, no further workup will be pursued at this time - appears to be secondary to metastatic bronchogenic carcinoma and underlying COPD - supplement oxygen for comfort -morphine 2mg  q 1 hr prn air hunger/sob -ativan 1mg  q 2 hrs prn anxiety Thrombocytopenia  -As the focus of care has been transitioned to a focus of total comfort, no further workup will be pursued at this time - no signs of active bleeding  Dementia associated with alcoholism  --As the focus of care has been transitioned to a focus of total comfort, no further workup will be pursued at this time PAF (paroxysmal atrial fibrillation)  --As the focus of care has been transitioned to a focus of total comfort, no further workup will be pursued at this time S/P CABG (coronary artery bypass graft)  Severe protein-calorie malnutrition  - secondary to progressive nature of the illness  - comfort feeding as pt able to tolerate        Past Medical History  Diagnosis Date  . Coronary atherosclerosis   . Pure hypercholesterolemia   .  Unspecified essential hypertension   . Shortness of breath   . Other diseases of lung, not elsewhere classified   . Influenza with other manifestations   . Other general symptoms(780.99)   . Urinary tract infection, site not specified   . Chills (without fever)   .  Squamous cell lung cancer    Past Surgical History  Procedure Laterality Date  . Coronary artery bypass graft  2007  . Thoracotomy  2006    right upper lobe  . Appendectomy  1945  . Cholecystectomy  1975  . Tonsillectomy  1942  . Hemorrhoid surgery  1975   Social History:  reports that he quit smoking about 9 years ago. His smoking use included Cigarettes. He has a 106 pack-year smoking history. He has never used smokeless tobacco. He reports that he drinks alcohol. He reports that he does not use illicit drugs.   Family History  Problem Relation Age of Onset  . Alzheimer's disease Father 80    deceased  . Other Mother 27  . Hyperlipidemia Sister   . Coronary artery disease Neg Hx   . Cancer Neg Hx   . Diabetes Neg Hx   . Heart disease Neg Hx      Allergies  Allergen Reactions  . Cephalexin     Unknown reaction Per MAR       Prior to Admission medications   Medication Sig Start Date End Date Taking? Authorizing Provider  albuterol (PROVENTIL) (2.5 MG/3ML) 0.083% nebulizer solution Take 3 mLs (2.5 mg total) by nebulization every 2 (two) hours as needed for wheezing. 03/25/14  Yes Theodis Blaze, MD  Amino Acids-Protein Hydrolys (FEEDING SUPPLEMENT, PRO-STAT SUGAR FREE 64,) LIQD Take 30 mLs by mouth 2 (two) times daily with a meal.   Yes Historical Provider, MD  ciprofloxacin (CIPRO) 250 MG tablet Take 250 mg by mouth 2 (two) times daily. 03/25/14  Yes Historical Provider, MD  ciprofloxacin (CIPRO) 250 MG tablet Take 250 mg by mouth 2 (two) times daily. 03/25/14  Yes Historical Provider, MD  docusate sodium (COLACE) 100 MG capsule Take 100 mg by mouth 2 (two) times daily.   Yes Historical Provider, MD  oxyCODONE (OXY IR/ROXICODONE) 5 MG immediate release tablet Take 5 mg by mouth every 4 (four) hours as needed for moderate pain.   Yes Historical Provider, MD    Review of Systems:  Unobtainable secondary to patient's mental status Physical Exam: Filed Vitals:   2014/03/30  1513 2014-03-30 1606 30-Mar-2014 1930 30-Mar-2014 2021  BP: 86/38 65/36    Pulse: 96 105 106 0  Resp: 36 36    Height:   5\' 6"  (1.676 m)   Weight:   63.05 kg (139 lb)   SpO2: 80% 72%     General:  NAD, emaciated.   Head/Eye: No conjunctival hemorrhage,+ icterus, [place]/AT, No nystagmus ENT: + icterus,  No thrush,  no pharyngeal exudate Neck:  No masses, no lymphadenpathy CV:  IRRR, no rub, no gallop Lung:  Bibasilar rales. No wheezing. Good air movement. Abdomen: soft/NT, +BS, nondistended, no peritoneal signs Ext: No cyanosis, No rashes, No petechiae, No lymphangitis, 1+LE edema  Labs on Admission:  Basic Metabolic Panel: No results found for this basename: NA, K, CL, CO2, GLUCOSE, BUN, CREATININE, CALCIUM, MG, PHOS,  in the last 168 hours Liver Function Tests: No results found for this basename: AST, ALT, ALKPHOS, BILITOT, PROT, ALBUMIN,  in the last 168 hours No results found for this basename: LIPASE, AMYLASE,  in the  last 168 hours No results found for this basename: AMMONIA,  in the last 168 hours CBC: No results found for this basename: WBC, NEUTROABS, HGB, HCT, MCV, PLT,  in the last 168 hours Cardiac Enzymes: No results found for this basename: CKTOTAL, CKMB, CKMBINDEX, TROPONINI,  in the last 168 hours BNP: No components found with this basename: POCBNP,  CBG: No results found for this basename: GLUCAP,  in the last 168 hours  Radiological Exams on Admission: No results found.      Time spent:50 minutes Code Status:   DNR/DNI Family Communication:   Daughter at bedside   Nthony Lefferts, DO  [facility] Pager [phone]  If 7PM-7AM, please contact night-coverage [email or web address] Password [email or web address] 04/01/2014, 1:55 PM

## 2014-04-01 NOTE — Discharge Summary (Signed)
Physician Discharge Summary  [name] SKA:[id number] DOB: 1930-10-19 DOA: Apr 14, 2014  PCP: Adella Hare, MD  Admit date: 14-Apr-2014 Discharge date: 2014-04-14  PATIENT EXPIRED AT 2021 ON 2014-04-14  Discharge Diagnoses:  Active Problems:   Acute blood loss anemia Acute Blood Loss Anemia  -As the focus of care has been transitioned to a focus of total comfort, no further workup will be pursued at this time  Acute hypoxic respiratory failure  --As the focus of care has been transitioned to a focus of total comfort, no further workup will be pursued at this time  - appears to be secondary to metastatic bronchogenic carcinoma and underlying COPD  - supplement oxygen for comfort  -morphine 2mg  q 1 hr prn air hunger/sob  -ativan 1mg  q 2 hrs prn anxiety  Thrombocytopenia  -As the focus of care has been transitioned to a focus of total comfort, no further workup will be pursued at this time  - no signs of active bleeding  Dementia associated with alcoholism  --As the focus of care has been transitioned to a focus of total comfort, no further workup will be pursued at this time  PAF (paroxysmal atrial fibrillation)  --As the focus of care has been transitioned to a focus of total comfort, no further workup will be pursued at this time  S/P CABG (coronary artery bypass graft)  Severe protein-calorie malnutrition  - secondary to progressive nature of the illness  - comfort feeding as pt able to tolerate   Discharge Condition: PATIENT EXPIRED  Disposition: PATIENT EXPIRED  Wt Readings from Last 3 Encounters:  04-14-2014 63.05 kg (139 lb)  03/23/14 63.5 kg (139 lb 15.9 oz)  01/25/14 67.042 kg (147 lb 12.8 oz)    History of present illness:  80 y.o. 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 During his last admission, the patient was admitted for increasing confusion and failure to thrive which was partly attributable to the patient's urinary tract infection. Today, the patient had a CT of the chest which was concerning for metastatic bronchogenic carcinoma. There is a left upper lobe mass with suspected overlying rib destruction. Other lung masses are noted. Adenopathy in the neck, chest, and abdomen is present. Bilateral adrenal masses are noted. Palliative medicine was consulted and the patient was made DO NOT RESUSCITATE. He was ultimately discharged to [facility] with the intention of ultimately transitioning the patient to hospice care.  The patient was discharged with ciprofloxacin and oxycodone IR for his pain. During routine labs obtained on the morning of 04/14/2014 at his nursing facility, his hemoglobin was noted to be 5.4. Result the patient was brought to the emergency department for further evaluation. The patient is unable to provide any history at this time secondary to his encephalopathy. All this history is obtained from review of the chart speaking with the patient's daughter at the bedside. The patient's daughter noted that the patient was more sleepy this morning, but denies any visible blood loss over the past several days.  In the emergency department, patient was noted to be tachycardic and hypoxemic with oxygen saturation in the 70s. Heart rate was in the 100-110s. The patient's daughter the bedside expressed a desire for transitioning care to focus of total comfort. She did not want any further blood draws  or radiographs at this time. The patient was given Ativan and morphine in the emergency department. He is currently not in any distress. Labs from the patient's nursing facility revealed WBC, 0, hemoglobin 12.4, platelets 86,000, serum creatinine 1.49, proBNP 2482.  After discussion with the patient's daughter at the  bedside, she expressed that she did not want any heroic measure to prolong the patient's life.  She stated that she wanted to focus care on the patient's comfort.  She di not want any further diagnostic tests; no labs, no xrays.  The patient was then started on morphine IV q 1 hrs prn pain and air hunger as well as ativan q 2 hrs prn anxiety.  Palliative medicine consultation was obtained with the intention to transfer the patient to [facility] service.  Unfortunately, the patient expired on 04/09/2014 @ 2021 before the patient can be seen by the palliative service.     Discharge Exam: Filed Vitals:   2014-04-09 2021  BP:   Pulse: 0  Resp:    Filed Vitals:   09-Apr-2014 1513 April 09, 2014 1606 04-09-2014 1930 09-Apr-2014 2021  BP: 86/38 65/36    Pulse: 96 105 106 0  Resp: 36 36    Height:   5\' 6"  (1.676 m)   Weight:   63.05 kg (139 lb)   SpO2: 80% 72%      Discharge Instructions     Medication List    ASK your doctor about these medications       albuterol (2.5 MG/3ML) 0.083% nebulizer solution  Commonly known as:  PROVENTIL  Take 3 mLs (2.5 mg total) by nebulization every 2 (two) hours as needed for wheezing.     ciprofloxacin 250 MG tablet  Commonly known as:  CIPRO  Take 250 mg by mouth 2 (two) times daily.     ciprofloxacin 250 MG tablet  Commonly known as:  CIPRO  Take 250 mg by mouth 2 (two) times daily.     docusate sodium 100 MG capsule  Commonly known as:  COLACE  Take 100 mg by mouth 2 (two) times daily.     feeding supplement (PRO-STAT SUGAR FREE 64) Liqd  Take 30 mLs by mouth 2 (two) times daily with a meal.     oxyCODONE 5 MG immediate release tablet  Commonly known as:  Oxy IR/ROXICODONE  Take 5 mg by mouth every 4 (four) hours as needed for moderate pain.         The results of significant diagnostics from this hospitalization (including imaging, microbiology, ancillary and laboratory) are listed below for reference.    Significant Diagnostic Studies: Ct  Head Wo Contrast  03/22/2014   CLINICAL DATA:  Status post fall with patient found on the floor ; history of dementia  EXAM: CT HEAD WITHOUT CONTRAST  TECHNIQUE: Contiguous axial images were obtained from the base of the skull through the vertex without intravenous contrast.  COMPARISON:  Noncontrast CT scan of the brain of April 26, 2010  FINDINGS: There is moderate diffuse cerebral and cerebellar atrophy with compensatory ventriculomegaly. This has progressed mildly since the previous study. The temporal lobes are most significantly involved by the atrophy. There is no intracranial hemorrhage. There is no acute ischemic change. The cerebellum and brainstem exhibit no acute abnormalities. The globes are grossly intact.  The observed paranasal sinuses and mastoid air cells are clear. There is no acute skull fracture. A small cephalohematoma over the posterior right parietal region is noted.  IMPRESSION: 1. There is no  acute intracranial hemorrhage nor evidence of acute ischemic change. 2.  Progressive moderate diffuse cerebral and cerebellar atrophy. 3. There is no acute skull fracture.   Electronically Signed   By: Quintez Maselli  [place]   On: 03/22/2014 13:41   Ct Chest Wo Contrast  03/22/2014   CLINICAL DATA:  Lung disease  EXAM: CT CHEST WITHOUT CONTRAST  TECHNIQUE: Multidetector CT imaging of the chest was performed following the standard protocol without IV contrast.  COMPARISON:  08/27/2012  FINDINGS: Lack of intravenous contrast markedly limits the evaluation, especially in the mediastinum.  Abnormal mediastinal and left supraclavicular adenopathy is present. Lymph nodes are difficult to measure. 14 mm precarinal node on image 24, 10 mm prevascular node on image 26, suspicious sub carinal soft tissue density on image 33, and 12 mm left supraclavicular node on image 11. Several other abnormal lymph nodes are present in the mediastinum.  Extensive coronary artery calcifications.  Status post sternotomy.  Small  left pleural effusion.  Confluent pulmonary density is present worrisome for pulmonary mass or malignancy. There is a peripheral mass on image 31 measuring 2.8 x 5.4 cm. The adjacent rib on image 30 has an abnormal appearance worrisome for bony invasion. 20 mm left lower lobe mass on image 36. 4.6 x 6.0 central right lung mass on image 29. There is prominent soft tissue in the left hilum on image 32. Adenopathy is not excluded.  Ascites. Cirrhotic liver. 17 mm gastrohepatic ligament node on image 58. Several other small nodes are present but are difficult to delineate.  Right and left adrenal mass is are 2.7 and 5.0 cm.  IMPRESSION: Findings are worrisome for metastatic bronchogenic carcinoma. There is a left upper lobe mass with suspected overlying rib destruction. Other lung masses are noted. Adenopathy in the neck, chest, and abdomen is present. Bilateral adrenal masses are noted. Repeat CT of the chest and abdomen with contrast is recommended. Also, consider PET-CT.  Cirrhosis with ascites.   Electronically Signed   By: Maryclare Bean M.D.   On: 03/22/2014 14:41   Dg Chest Port 1 View  03/22/2014   CLINICAL DATA:  Weakness and hypoxia  EXAM: PORTABLE CHEST - 1 VIEW  COMPARISON:  PA and lateral chest of September 06, 2009 and CT scan of the chest of August 27, 2012  FINDINGS: There is chronic elevation of the right hemidiaphragm. The interstitial markings of both lungs are increased which is a new finding. There is hilar prominence bilaterally. The cardiac silhouette is normal in size. The pulmonary vascularity is not engorged. There are 10 sternal wires present. These are intact. There surgical clips in the right hilum, overlying the inferior aspect of the cardiac silhouette to the left of midline, and over the right lower lateral thoracic wall. No acute abnormality of the bony thorax is demonstrated.  IMPRESSION: Mildly increased pulmonary interstitial markings may reflect pneumonia or CHF or a combination of both.  The possibility of lymphangitic spread of malignancy on the left is raised. Follow-up chest CT scanning is recommended.   Electronically Signed   By: Lilybeth Vien  [place]   On: 03/22/2014 13:10     Microbiology: Recent Results (from the past 240 hour(s))  URINE CULTURE     Status: None   Collection Time    03/24/14  1:53 AM      Result Value Ref Range Status   Specimen Description URINE, CATHETERIZED   Final   Special Requests NONE   Final   Culture  Setup Time  Final   Value: 03/24/2014 09:15     Performed at [place]     Final   Value: NO GROWTH     Performed at [facility]   Culture     Final   Value: NO GROWTH     Performed at [facility]   Report Status 03/25/2014 FINAL   Final     Labs: Basic Metabolic Panel: No results found for this basename: NA, K, CL, CO2, GLUCOSE, BUN, CREATININE, CALCIUM, MG, PHOS,  in the last 168 hours Liver Function Tests: No results found for this basename: AST, ALT, ALKPHOS, BILITOT, PROT, ALBUMIN,  in the last 168 hours No results found for this basename: LIPASE, AMYLASE,  in the last 168 hours No results found for this basename: AMMONIA,  in the last 168 hours CBC: No results found for this basename: WBC, NEUTROABS, HGB, HCT, MCV, PLT,  in the last 168 hours Cardiac Enzymes: No results found for this basename: CKTOTAL, CKMB, CKMBINDEX, TROPONINI,  in the last 168 hours BNP: No components found with this basename: POCBNP,  CBG: No results found for this basename: GLUCAP,  in the last 168 hours  Time coordinating discharge:  Greater than 30 minutes  Signed:  Kalista Laguardia, DO [facility] Pager: [phone] 04/01/2014, 4:19 PM

## 2014-04-07 NOTE — ED Provider Notes (Signed)
CSN: [id number]     Arrival date & time Apr 03, 2014  1331 History   First MD Initiated Contact with Patient 03-Apr-2014 1340     Chief Complaint  Patient presents with  . Anemia     (Consider location/radiation/quality/duration/timing/severity/associated sxs/prior Treatment) HPI The patient is sent from nursing home care with low hemoglobin and hypotension. The patient has a known history of metastatic lung cancer and multiple other medical problems. His daughter reports his mental status has had waxing and waning over some time. However he did seem better over these past 2 days and she got a call just in the past few hours if needed significant change. I have reviewed the patient's medical record and discuss his current condition with his daughter. She reports at this point in time given the severity of his known medical problems that treating him for any pain and anxiety is their wish him not to pursue other diagnostic workup or treatment.  Past Medical History  Diagnosis Date  . Coronary atherosclerosis   . Pure hypercholesterolemia   . Unspecified essential hypertension   . Shortness of breath   . Other diseases of lung, not elsewhere classified   . Influenza with other manifestations   . Other general symptoms(780.99)   . Urinary tract infection, site not specified   . Chills (without fever)   . Squamous cell lung cancer    Past Surgical History  Procedure Laterality Date  . Coronary artery bypass graft  2007  . Thoracotomy  2006    right upper lobe  . Appendectomy  1945  . Cholecystectomy  1975  . Tonsillectomy  1942  . Hemorrhoid surgery  1975   Family History  Problem Relation Age of Onset  . Alzheimer's disease Father 75    deceased  . Other Mother 18  . Hyperlipidemia Sister   . Coronary artery disease Neg Hx   . Cancer Neg Hx   . Diabetes Neg Hx   . Heart disease Neg Hx    History  Substance Use Topics  . Smoking status: Former Smoker -- 2.00 packs/day for 53  years    Types: Cigarettes    Quit date: 06/07/2004  . Smokeless tobacco: Never Used  . Alcohol Use: Yes     Comment: no etoh in several months--pt used to drink 2-3 bottles daily of wine, decreased to 1 bottle daily, now down to 1-2 glasses daily    Review of Systems  Patient cannot perform review of systems.  Allergies  Cephalexin  Home Medications   Prior to Admission medications   Medication Sig Start Date End Date Taking? Authorizing Provider  albuterol (PROVENTIL) (2.5 MG/3ML) 0.083% nebulizer solution Take 3 mLs (2.5 mg total) by nebulization every 2 (two) hours as needed for wheezing. 03/25/14  Yes Theodis Blaze, MD  Amino Acids-Protein Hydrolys (FEEDING SUPPLEMENT, PRO-STAT SUGAR FREE 64,) LIQD Take 30 mLs by mouth 2 (two) times daily with a meal.   Yes Historical Provider, MD  ciprofloxacin (CIPRO) 250 MG tablet Take 250 mg by mouth 2 (two) times daily. 03/25/14  Yes Historical Provider, MD  ciprofloxacin (CIPRO) 250 MG tablet Take 250 mg by mouth 2 (two) times daily. 03/25/14  Yes Historical Provider, MD  docusate sodium (COLACE) 100 MG capsule Take 100 mg by mouth 2 (two) times daily.   Yes Historical Provider, MD  oxyCODONE (OXY IR/ROXICODONE) 5 MG immediate release tablet Take 5 mg by mouth every 4 (four) hours as needed for moderate pain.  Yes Historical Provider, MD   BP 113/49  Pulse 92  Resp 28  SpO2 77% Physical Exam General: This patient is cachectic pale severely ill in appearance. His speech is erratic and confused. Oral cavity: Mucous membranes are dry. Lungs patient has coarse rail and rhonchi particularly on the right. Heart: Tachycardic Abdomen: Soft and distended. Extremities: The upper extremities are cachectic. The lower extremity to have about 2+ pitting edema. With multiple small excoriations and skin thinning. Neurologic: The patient is confused consistent with delirium. He is moving extremities with some purpose but does not follow commands. Skin:  Sallow in appearance dry thin, multiple senile ecchymoses.  ED Course  Procedures (including critical care time) Labs Review Labs Reviewed - No data to display  Imaging Review No results found.   EKG Interpretation None      MDM   Final diagnoses:  Terminal care  Lung cancer, primary, with metastasis from lung to other site, unspecified laterality  Anemia, unspecified anemia type   The patient presents as outlined above with terminal metastatic cancer and multiple other medical comorbidities. The patient is DO NOT RESUSCITATE and I have discussed his management with his daughter and confirms that their wishes are for comfort measures only. This will be initiated in the emergency department with morphine Ativan and Zofran in PRN doses.  I have done several rechecks on the patient during the course of his stay in the emergency department for response to pain medications. At this time he does seem more relaxed and comfortable with plan admission to the hospitalist for continued comfort care.  Charlesetta Shanks, MD 2014-04-01 1459

## 2014-04-07 NOTE — Progress Notes (Signed)
History and Physical  [name] IEP:[id number] DOB: Jun 23, 1931 DOA: 2014/04/13   PCP: Adella Hare, MD   Chief Complaint: lethargy and low hemoglobin  HPI:  80 y.o. 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  During his last admission, the patient was admitted for increasing confusion and failure to thrive which was partly attributable to the patient's urinary tract infection. Today, the patient had a CT of the chest which was concerning for metastatic bronchogenic carcinoma. There is a left upper lobe mass with suspected overlying rib destruction. Other lung masses are noted. Adenopathy in the neck, chest, and abdomen is present. Bilateral adrenal masses are noted. Palliative medicine was consulted and the patient was made DO NOT RESUSCITATE. He was ultimately discharged to [facility] with the intention of ultimately transitioning the patient to hospice care. The patient was discharged with ciprofloxacin and oxycodone IR for his pain. During routine labs obtained on the morning of 04-13-2014  at his nursing facility, his hemoglobin was noted to be 5.4. Result the patient was brought to the emergency department for further evaluation. The patient is unable to provide any history at this time secondary to his encephalopathy. All this history is obtained from review of the chart speaking with the patient's daughter at the bedside. The patient's daughter noted that the patient was more sleepy this morning, but denies any visible blood loss over the past several days.  In the emergency department, patient was noted to be tachycardic and hypoxemic with oxygen saturation in the 70s. Heart rate was in the 100-110s. The patient's daughter the bedside  expressed a desire for transitioning care to focus of total comfort. She did not want any further blood draws or radiographs at this time. The patient was given Ativan and morphine in the emergency department. He is currently not in any distress. Labs from the patient's nursing facility revealed WBC, 0, hemoglobin 12.4, platelets 86,000, serum creatinine 1.49, proBNP 2482.  Assessment/Plan: Acute Blood Loss Anemia -As the focus of care has been transitioned to a focus of total comfort, no further workup will be pursued at this time Acute hypoxic respiratory failure  --As the focus of care has been transitioned to a focus of total comfort, no further workup will be pursued at this time - appears to be secondary to metastatic bronchogenic carcinoma and underlying COPD - supplement oxygen for comfort -morphine 2mg  q 1 hr prn air hunger/sob -ativan 1mg  q 2 hrs prn anxiety Thrombocytopenia  -As the focus of care has been transitioned to a focus of total comfort, no further workup will be pursued at this time - no signs of active bleeding  Dementia associated with alcoholism  --As the focus of care has been transitioned to a focus of total comfort, no further workup will be pursued at this time PAF (paroxysmal atrial fibrillation)  --As the focus of care has been transitioned to a focus of total comfort, no further workup will be pursued at this time S/P CABG (coronary artery bypass graft)  Severe protein-calorie malnutrition  - secondary to progressive nature of the illness  - comfort feeding as pt able to tolerate        Past Medical History  Diagnosis Date  . Coronary atherosclerosis   . Pure hypercholesterolemia   .  Unspecified essential hypertension   . Shortness of breath   . Other diseases of lung, not elsewhere classified   . Influenza with other manifestations   . Other general symptoms(780.99)   . Urinary tract infection, site not specified   . Chills (without fever)   .  Squamous cell lung cancer    Past Surgical History  Procedure Laterality Date  . Coronary artery bypass graft  2007  . Thoracotomy  2006    right upper lobe  . Appendectomy  1945  . Cholecystectomy  1975  . Tonsillectomy  1942  . Hemorrhoid surgery  1975   Social History:  reports that he quit smoking about 9 years ago. His smoking use included Cigarettes. He has a 106 pack-year smoking history. He has never used smokeless tobacco. He reports that he drinks alcohol. He reports that he does not use illicit drugs.   Family History  Problem Relation Age of Onset  . Alzheimer's disease Father 31    deceased  . Other Mother 60  . Hyperlipidemia Sister   . Coronary artery disease Neg Hx   . Cancer Neg Hx   . Diabetes Neg Hx   . Heart disease Neg Hx      Allergies  Allergen Reactions  . Cephalexin     Unknown reaction Per MAR       Prior to Admission medications   Medication Sig Start Date End Date Taking? Authorizing Provider  albuterol (PROVENTIL) (2.5 MG/3ML) 0.083% nebulizer solution Take 3 mLs (2.5 mg total) by nebulization every 2 (two) hours as needed for wheezing. 03/25/14  Yes Theodis Blaze, MD  Amino Acids-Protein Hydrolys (FEEDING SUPPLEMENT, PRO-STAT SUGAR FREE 64,) LIQD Take 30 mLs by mouth 2 (two) times daily with a meal.   Yes Historical Provider, MD  ciprofloxacin (CIPRO) 250 MG tablet Take 250 mg by mouth 2 (two) times daily. 03/25/14  Yes Historical Provider, MD  ciprofloxacin (CIPRO) 250 MG tablet Take 250 mg by mouth 2 (two) times daily. 03/25/14  Yes Historical Provider, MD  docusate sodium (COLACE) 100 MG capsule Take 100 mg by mouth 2 (two) times daily.   Yes Historical Provider, MD  oxyCODONE (OXY IR/ROXICODONE) 5 MG immediate release tablet Take 5 mg by mouth every 4 (four) hours as needed for moderate pain.   Yes Historical Provider, MD    Review of Systems:  Unobtainable secondary to patient's mental status Physical Exam: Filed Vitals:   2014-04-17  1415 Apr 17, 2014 1420 17-Apr-2014 1439 2014/04/17 1446  BP:      Pulse: 111  109 92  Resp: 14 28    SpO2: 97%  71% 77%   General:  NAD, emaciated.   Head/Eye: No conjunctival hemorrhage,+ icterus, [place]/AT, No nystagmus ENT: + icterus,  No thrush,  no pharyngeal exudate Neck:  No masses, no lymphadenpathy CV:  IRRR, no rub, no gallop Lung:  Bibasilar rales. No wheezing. Good air movement. Abdomen: soft/NT, +BS, nondistended, no peritoneal signs Ext: No cyanosis, No rashes, No petechiae, No lymphangitis, 1+LE edema  Labs on Admission:  Basic Metabolic Panel:  Recent Labs Lab 03/22/14 1317 03/23/14 0500 03/24/14 0025  NA 142 140 136*  K 4.0 3.8 3.9  CL 109 110 106  CO2 23 23 21   GLUCOSE 88 93 93  BUN 28* 27* 25*  CREATININE 1.16 1.21 1.02  CALCIUM 8.1* 7.6* 7.6*   Liver Function Tests:  Recent Labs Lab 03/22/14 1317 03/23/14 0500  AST 27 18  ALT 9 7  ALKPHOS 90 57  BILITOT 1.3* 1.0  PROT 8.1 6.7  ALBUMIN 2.0* 1.6*   No results found for this basename: LIPASE, AMYLASE,  in the last 168 hours No results found for this basename: AMMONIA,  in the last 168 hours CBC:  Recent Labs Lab 03/22/14 1317 03/23/14 0500 03/24/14 0025  WBC 12.5* 9.0 9.1  NEUTROABS 9.9*  --   --   HGB 10.7* 8.5* 9.3*  HCT 31.0* 25.1* 26.6*  MCV 102.3* 100.8* 97.4  PLT 113* 94* 94*   Cardiac Enzymes: No results found for this basename: CKTOTAL, CKMB, CKMBINDEX, TROPONINI,  in the last 168 hours BNP: No components found with this basename: POCBNP,  CBG: No results found for this basename: GLUCAP,  in the last 168 hours  Radiological Exams on Admission: No results found.      Time spent:50 minutes Code Status:   DNR/DNI Family Communication:   Daughter at bedside   Fatumata Kashani, DO  [facility] Pager [phone]  If 7PM-7AM, please contact night-coverage [email or web address] Password [facility] 04/18/14, 3:14 PM

## 2014-04-07 NOTE — Telephone Encounter (Signed)
[facility] 

## 2014-04-07 NOTE — Progress Notes (Signed)
Daughter came to desk from room. Stating she thought patient had stopped breathing. Upon entering room patient found without pulse nor respirations. DNR status confirmed. Another RN, [employer], verified that patient had expired.

## 2014-04-07 NOTE — Progress Notes (Signed)
Paged NP Schorr about patient expiring.

## 2014-04-07 NOTE — ED Notes (Signed)
Report given to [place], [place]

## 2014-04-07 NOTE — ED Notes (Addendum)
Pt from [place] with DNR and Hx of lung cancer reports to ED for Hbg 5.4, Hct 15.4. Pt appears pale, O2 saturation 82% on room air, blood pressure 90/40. Family at bedside. Pt has Hx of HTN and does not wear home O2.

## 2014-04-07 DEATH — deceased
